# Patient Record
Sex: Female | Born: 1950 | Race: White | Hispanic: Yes | Marital: Single | Smoking: Never smoker
Health system: Southern US, Community
[De-identification: ages and names within clinical notes are randomized; demographics above are authoritative.]

---

## 2014-07-05 ENCOUNTER — Other Ambulatory Visit: Payer: Self-pay

## 2014-07-05 DIAGNOSIS — Z1231 Encounter for screening mammogram for malignant neoplasm of breast: Secondary | ICD-10-CM

## 2014-07-25 ENCOUNTER — Ambulatory Visit
Admission: RE | Admit: 2014-07-25 | Discharge: 2014-07-25 | Disposition: A | Discharge status: Home / Self Care | Payer: BC Managed Care – PPO | Source: Ambulatory Visit

## 2014-07-25 ENCOUNTER — Encounter (INDEPENDENT_AMBULATORY_CARE_PROVIDER_SITE_OTHER): Payer: Self-pay

## 2014-07-25 DIAGNOSIS — Z1231 Encounter for screening mammogram for malignant neoplasm of breast: Secondary | ICD-10-CM

## 2015-05-31 DIAGNOSIS — H43393 Other vitreous opacities, bilateral: Secondary | ICD-10-CM | POA: Diagnosis not present

## 2015-05-31 DIAGNOSIS — Z947 Corneal transplant status: Secondary | ICD-10-CM | POA: Diagnosis not present

## 2015-05-31 DIAGNOSIS — H2511 Age-related nuclear cataract, right eye: Secondary | ICD-10-CM | POA: Diagnosis not present

## 2015-06-06 DIAGNOSIS — H40003 Preglaucoma, unspecified, bilateral: Secondary | ICD-10-CM | POA: Diagnosis not present

## 2015-06-06 DIAGNOSIS — H2511 Age-related nuclear cataract, right eye: Secondary | ICD-10-CM | POA: Diagnosis not present

## 2015-06-06 DIAGNOSIS — Z961 Presence of intraocular lens: Secondary | ICD-10-CM | POA: Diagnosis not present

## 2015-06-06 DIAGNOSIS — H18603 Keratoconus, unspecified, bilateral: Secondary | ICD-10-CM | POA: Diagnosis not present

## 2015-07-10 DIAGNOSIS — H40013 Open angle with borderline findings, low risk, bilateral: Secondary | ICD-10-CM | POA: Diagnosis not present

## 2015-08-08 DIAGNOSIS — H4011X4 Primary open-angle glaucoma, indeterminate stage: Secondary | ICD-10-CM | POA: Diagnosis not present

## 2015-08-08 DIAGNOSIS — Z9889 Other specified postprocedural states: Secondary | ICD-10-CM | POA: Diagnosis not present

## 2015-08-08 DIAGNOSIS — H2511 Age-related nuclear cataract, right eye: Secondary | ICD-10-CM | POA: Diagnosis not present

## 2015-08-08 DIAGNOSIS — H18603 Keratoconus, unspecified, bilateral: Secondary | ICD-10-CM | POA: Diagnosis not present

## 2015-09-11 ENCOUNTER — Other Ambulatory Visit: Payer: Self-pay

## 2015-09-11 DIAGNOSIS — Z1231 Encounter for screening mammogram for malignant neoplasm of breast: Secondary | ICD-10-CM

## 2015-09-27 ENCOUNTER — Ambulatory Visit
Admission: RE | Admit: 2015-09-27 | Discharge: 2015-09-27 | Disposition: A | Discharge status: Home / Self Care | Payer: Medicare Other | Source: Ambulatory Visit

## 2015-09-27 DIAGNOSIS — Z1231 Encounter for screening mammogram for malignant neoplasm of breast: Secondary | ICD-10-CM | POA: Diagnosis not present

## 2015-10-01 DIAGNOSIS — H2511 Age-related nuclear cataract, right eye: Secondary | ICD-10-CM | POA: Diagnosis not present

## 2015-10-01 DIAGNOSIS — H401124 Primary open-angle glaucoma, left eye, indeterminate stage: Secondary | ICD-10-CM | POA: Diagnosis not present

## 2015-10-01 DIAGNOSIS — H40001 Preglaucoma, unspecified, right eye: Secondary | ICD-10-CM | POA: Diagnosis not present

## 2015-10-01 DIAGNOSIS — H18603 Keratoconus, unspecified, bilateral: Secondary | ICD-10-CM | POA: Diagnosis not present

## 2015-10-01 DIAGNOSIS — Z9889 Other specified postprocedural states: Secondary | ICD-10-CM | POA: Diagnosis not present

## 2015-10-01 DIAGNOSIS — Z961 Presence of intraocular lens: Secondary | ICD-10-CM | POA: Diagnosis not present

## 2015-11-20 DIAGNOSIS — E039 Hypothyroidism, unspecified: Secondary | ICD-10-CM | POA: Diagnosis not present

## 2015-11-20 DIAGNOSIS — Z780 Asymptomatic menopausal state: Secondary | ICD-10-CM | POA: Diagnosis not present

## 2015-11-20 DIAGNOSIS — Z0000 Encounter for general adult medical examination without abnormal findings: Secondary | ICD-10-CM | POA: Diagnosis not present

## 2015-11-20 DIAGNOSIS — Z79899 Other long term (current) drug therapy: Secondary | ICD-10-CM | POA: Diagnosis not present

## 2015-11-20 DIAGNOSIS — Z23 Encounter for immunization: Secondary | ICD-10-CM | POA: Diagnosis not present

## 2015-11-20 DIAGNOSIS — E7800 Pure hypercholesterolemia, unspecified: Secondary | ICD-10-CM | POA: Diagnosis not present

## 2015-11-21 DIAGNOSIS — Z79899 Other long term (current) drug therapy: Secondary | ICD-10-CM | POA: Diagnosis not present

## 2015-11-21 DIAGNOSIS — E7800 Pure hypercholesterolemia, unspecified: Secondary | ICD-10-CM | POA: Diagnosis not present

## 2015-11-21 DIAGNOSIS — E039 Hypothyroidism, unspecified: Secondary | ICD-10-CM | POA: Diagnosis not present

## 2015-12-12 DIAGNOSIS — M8589 Other specified disorders of bone density and structure, multiple sites: Secondary | ICD-10-CM | POA: Diagnosis not present

## 2015-12-12 DIAGNOSIS — Z780 Asymptomatic menopausal state: Secondary | ICD-10-CM | POA: Diagnosis not present

## 2015-12-25 DIAGNOSIS — H18603 Keratoconus, unspecified, bilateral: Secondary | ICD-10-CM | POA: Diagnosis not present

## 2015-12-25 DIAGNOSIS — Z9889 Other specified postprocedural states: Secondary | ICD-10-CM | POA: Diagnosis not present

## 2015-12-25 DIAGNOSIS — H04123 Dry eye syndrome of bilateral lacrimal glands: Secondary | ICD-10-CM | POA: Diagnosis not present

## 2015-12-25 DIAGNOSIS — H2511 Age-related nuclear cataract, right eye: Secondary | ICD-10-CM | POA: Diagnosis not present

## 2015-12-25 DIAGNOSIS — Z961 Presence of intraocular lens: Secondary | ICD-10-CM | POA: Diagnosis not present

## 2015-12-25 DIAGNOSIS — H40001 Preglaucoma, unspecified, right eye: Secondary | ICD-10-CM | POA: Diagnosis not present

## 2015-12-25 DIAGNOSIS — H401124 Primary open-angle glaucoma, left eye, indeterminate stage: Secondary | ICD-10-CM | POA: Diagnosis not present

## 2016-02-18 DIAGNOSIS — H40001 Preglaucoma, unspecified, right eye: Secondary | ICD-10-CM | POA: Diagnosis not present

## 2016-02-18 DIAGNOSIS — H401121 Primary open-angle glaucoma, left eye, mild stage: Secondary | ICD-10-CM | POA: Diagnosis not present

## 2016-02-18 DIAGNOSIS — Z9889 Other specified postprocedural states: Secondary | ICD-10-CM | POA: Diagnosis not present

## 2016-02-18 DIAGNOSIS — Z961 Presence of intraocular lens: Secondary | ICD-10-CM | POA: Diagnosis not present

## 2016-03-28 DIAGNOSIS — H43393 Other vitreous opacities, bilateral: Secondary | ICD-10-CM | POA: Diagnosis not present

## 2016-03-28 DIAGNOSIS — Z961 Presence of intraocular lens: Secondary | ICD-10-CM | POA: Diagnosis not present

## 2016-03-28 DIAGNOSIS — H2511 Age-related nuclear cataract, right eye: Secondary | ICD-10-CM | POA: Diagnosis not present

## 2016-03-28 DIAGNOSIS — H401124 Primary open-angle glaucoma, left eye, indeterminate stage: Secondary | ICD-10-CM | POA: Diagnosis not present

## 2016-03-28 DIAGNOSIS — T86848 Other complications of corneal transplant: Secondary | ICD-10-CM | POA: Diagnosis not present

## 2016-06-24 DIAGNOSIS — Z9889 Other specified postprocedural states: Secondary | ICD-10-CM | POA: Diagnosis not present

## 2016-06-24 DIAGNOSIS — H18603 Keratoconus, unspecified, bilateral: Secondary | ICD-10-CM | POA: Diagnosis not present

## 2016-06-24 DIAGNOSIS — H04123 Dry eye syndrome of bilateral lacrimal glands: Secondary | ICD-10-CM | POA: Diagnosis not present

## 2016-06-24 DIAGNOSIS — Z961 Presence of intraocular lens: Secondary | ICD-10-CM | POA: Diagnosis not present

## 2016-06-24 DIAGNOSIS — H401121 Primary open-angle glaucoma, left eye, mild stage: Secondary | ICD-10-CM | POA: Diagnosis not present

## 2016-07-02 DIAGNOSIS — H2511 Age-related nuclear cataract, right eye: Secondary | ICD-10-CM | POA: Diagnosis not present

## 2016-07-02 DIAGNOSIS — Z961 Presence of intraocular lens: Secondary | ICD-10-CM | POA: Diagnosis not present

## 2016-07-02 DIAGNOSIS — H40001 Preglaucoma, unspecified, right eye: Secondary | ICD-10-CM | POA: Diagnosis not present

## 2016-07-02 DIAGNOSIS — H401124 Primary open-angle glaucoma, left eye, indeterminate stage: Secondary | ICD-10-CM | POA: Diagnosis not present

## 2016-07-02 DIAGNOSIS — H401121 Primary open-angle glaucoma, left eye, mild stage: Secondary | ICD-10-CM | POA: Diagnosis not present

## 2016-07-02 DIAGNOSIS — Z9889 Other specified postprocedural states: Secondary | ICD-10-CM | POA: Diagnosis not present

## 2016-08-31 DIAGNOSIS — R300 Dysuria: Secondary | ICD-10-CM | POA: Diagnosis not present

## 2016-08-31 DIAGNOSIS — N390 Urinary tract infection, site not specified: Secondary | ICD-10-CM | POA: Diagnosis not present

## 2016-10-23 ENCOUNTER — Other Ambulatory Visit: Payer: Self-pay | Admitting: Geriatric Medicine

## 2016-10-29 ENCOUNTER — Other Ambulatory Visit: Payer: Self-pay | Admitting: Geriatric Medicine

## 2016-10-29 DIAGNOSIS — Z1231 Encounter for screening mammogram for malignant neoplasm of breast: Secondary | ICD-10-CM

## 2016-11-21 DIAGNOSIS — Z1389 Encounter for screening for other disorder: Secondary | ICD-10-CM | POA: Diagnosis not present

## 2016-11-21 DIAGNOSIS — Z79899 Other long term (current) drug therapy: Secondary | ICD-10-CM | POA: Diagnosis not present

## 2016-11-21 DIAGNOSIS — Z6835 Body mass index (BMI) 35.0-35.9, adult: Secondary | ICD-10-CM | POA: Diagnosis not present

## 2016-11-21 DIAGNOSIS — Z23 Encounter for immunization: Secondary | ICD-10-CM | POA: Diagnosis not present

## 2016-11-21 DIAGNOSIS — E039 Hypothyroidism, unspecified: Secondary | ICD-10-CM | POA: Diagnosis not present

## 2016-11-21 DIAGNOSIS — E669 Obesity, unspecified: Secondary | ICD-10-CM | POA: Diagnosis not present

## 2016-11-21 DIAGNOSIS — E782 Mixed hyperlipidemia: Secondary | ICD-10-CM | POA: Diagnosis not present

## 2016-11-21 DIAGNOSIS — Z0000 Encounter for general adult medical examination without abnormal findings: Secondary | ICD-10-CM | POA: Diagnosis not present

## 2016-11-21 DIAGNOSIS — Z1239 Encounter for other screening for malignant neoplasm of breast: Secondary | ICD-10-CM | POA: Diagnosis not present

## 2016-12-02 ENCOUNTER — Ambulatory Visit
Admission: RE | Admit: 2016-12-02 | Discharge: 2016-12-02 | Disposition: A | Discharge status: Home / Self Care | Payer: Medicare Other | Source: Ambulatory Visit | Attending: Geriatric Medicine | Admitting: Geriatric Medicine

## 2016-12-02 ENCOUNTER — Encounter: Payer: Self-pay | Admitting: Radiology

## 2016-12-02 DIAGNOSIS — Z1231 Encounter for screening mammogram for malignant neoplasm of breast: Secondary | ICD-10-CM

## 2017-01-08 DIAGNOSIS — Z9889 Other specified postprocedural states: Secondary | ICD-10-CM | POA: Diagnosis not present

## 2017-01-08 DIAGNOSIS — H401121 Primary open-angle glaucoma, left eye, mild stage: Secondary | ICD-10-CM | POA: Diagnosis not present

## 2017-01-08 DIAGNOSIS — Z961 Presence of intraocular lens: Secondary | ICD-10-CM | POA: Diagnosis not present

## 2017-01-08 DIAGNOSIS — H2511 Age-related nuclear cataract, right eye: Secondary | ICD-10-CM | POA: Diagnosis not present

## 2017-01-08 DIAGNOSIS — H18603 Keratoconus, unspecified, bilateral: Secondary | ICD-10-CM | POA: Diagnosis not present

## 2017-01-08 DIAGNOSIS — H40001 Preglaucoma, unspecified, right eye: Secondary | ICD-10-CM | POA: Diagnosis not present

## 2017-03-05 DIAGNOSIS — S39012A Strain of muscle, fascia and tendon of lower back, initial encounter: Secondary | ICD-10-CM | POA: Diagnosis not present

## 2017-03-27 DIAGNOSIS — Z9889 Other specified postprocedural states: Secondary | ICD-10-CM | POA: Diagnosis not present

## 2017-03-27 DIAGNOSIS — H18603 Keratoconus, unspecified, bilateral: Secondary | ICD-10-CM | POA: Diagnosis not present

## 2017-07-28 DIAGNOSIS — Z961 Presence of intraocular lens: Secondary | ICD-10-CM | POA: Diagnosis not present

## 2017-07-28 DIAGNOSIS — H2511 Age-related nuclear cataract, right eye: Secondary | ICD-10-CM | POA: Diagnosis not present

## 2017-07-28 DIAGNOSIS — H43393 Other vitreous opacities, bilateral: Secondary | ICD-10-CM | POA: Diagnosis not present

## 2017-07-28 DIAGNOSIS — H18613 Keratoconus, stable, bilateral: Secondary | ICD-10-CM | POA: Diagnosis not present

## 2017-07-28 DIAGNOSIS — Z947 Corneal transplant status: Secondary | ICD-10-CM | POA: Diagnosis not present

## 2017-07-28 DIAGNOSIS — H401124 Primary open-angle glaucoma, left eye, indeterminate stage: Secondary | ICD-10-CM | POA: Diagnosis not present

## 2017-08-11 DIAGNOSIS — B078 Other viral warts: Secondary | ICD-10-CM | POA: Diagnosis not present

## 2017-08-11 DIAGNOSIS — L821 Other seborrheic keratosis: Secondary | ICD-10-CM | POA: Diagnosis not present

## 2017-08-11 DIAGNOSIS — L814 Other melanin hyperpigmentation: Secondary | ICD-10-CM | POA: Diagnosis not present

## 2017-09-22 DIAGNOSIS — Z23 Encounter for immunization: Secondary | ICD-10-CM | POA: Diagnosis not present

## 2017-10-06 DIAGNOSIS — J9801 Acute bronchospasm: Secondary | ICD-10-CM | POA: Diagnosis not present

## 2017-10-06 DIAGNOSIS — J0110 Acute frontal sinusitis, unspecified: Secondary | ICD-10-CM | POA: Diagnosis not present

## 2017-10-13 DIAGNOSIS — Z947 Corneal transplant status: Secondary | ICD-10-CM | POA: Diagnosis not present

## 2017-10-13 DIAGNOSIS — H18613 Keratoconus, stable, bilateral: Secondary | ICD-10-CM | POA: Diagnosis not present

## 2017-11-13 DIAGNOSIS — H18613 Keratoconus, stable, bilateral: Secondary | ICD-10-CM | POA: Diagnosis not present

## 2017-11-27 DIAGNOSIS — H40001 Preglaucoma, unspecified, right eye: Secondary | ICD-10-CM | POA: Diagnosis not present

## 2017-11-27 DIAGNOSIS — H18603 Keratoconus, unspecified, bilateral: Secondary | ICD-10-CM | POA: Diagnosis not present

## 2017-11-27 DIAGNOSIS — H2511 Age-related nuclear cataract, right eye: Secondary | ICD-10-CM | POA: Diagnosis not present

## 2017-11-27 DIAGNOSIS — H401121 Primary open-angle glaucoma, left eye, mild stage: Secondary | ICD-10-CM | POA: Diagnosis not present

## 2017-11-27 DIAGNOSIS — Z9889 Other specified postprocedural states: Secondary | ICD-10-CM | POA: Diagnosis not present

## 2017-11-27 DIAGNOSIS — Z961 Presence of intraocular lens: Secondary | ICD-10-CM | POA: Diagnosis not present

## 2017-12-08 DIAGNOSIS — Z6836 Body mass index (BMI) 36.0-36.9, adult: Secondary | ICD-10-CM | POA: Diagnosis not present

## 2017-12-08 DIAGNOSIS — Z0000 Encounter for general adult medical examination without abnormal findings: Secondary | ICD-10-CM | POA: Diagnosis not present

## 2017-12-08 DIAGNOSIS — E669 Obesity, unspecified: Secondary | ICD-10-CM | POA: Diagnosis not present

## 2017-12-08 DIAGNOSIS — E039 Hypothyroidism, unspecified: Secondary | ICD-10-CM | POA: Diagnosis not present

## 2017-12-08 DIAGNOSIS — Z1389 Encounter for screening for other disorder: Secondary | ICD-10-CM | POA: Diagnosis not present

## 2017-12-08 DIAGNOSIS — R030 Elevated blood-pressure reading, without diagnosis of hypertension: Secondary | ICD-10-CM | POA: Diagnosis not present

## 2017-12-08 DIAGNOSIS — Z79899 Other long term (current) drug therapy: Secondary | ICD-10-CM | POA: Diagnosis not present

## 2017-12-08 DIAGNOSIS — E782 Mixed hyperlipidemia: Secondary | ICD-10-CM | POA: Diagnosis not present

## 2017-12-08 DIAGNOSIS — Z1239 Encounter for other screening for malignant neoplasm of breast: Secondary | ICD-10-CM | POA: Diagnosis not present

## 2017-12-11 ENCOUNTER — Other Ambulatory Visit: Payer: Self-pay | Admitting: Geriatric Medicine

## 2017-12-11 DIAGNOSIS — Z1231 Encounter for screening mammogram for malignant neoplasm of breast: Secondary | ICD-10-CM

## 2017-12-31 ENCOUNTER — Ambulatory Visit
Admission: RE | Admit: 2017-12-31 | Discharge: 2017-12-31 | Disposition: A | Discharge status: Home / Self Care | Payer: Medicare Other | Source: Ambulatory Visit | Attending: Geriatric Medicine | Admitting: Geriatric Medicine

## 2017-12-31 DIAGNOSIS — Z1231 Encounter for screening mammogram for malignant neoplasm of breast: Secondary | ICD-10-CM

## 2018-01-22 DIAGNOSIS — H2511 Age-related nuclear cataract, right eye: Secondary | ICD-10-CM | POA: Diagnosis not present

## 2018-01-22 DIAGNOSIS — H40001 Preglaucoma, unspecified, right eye: Secondary | ICD-10-CM | POA: Diagnosis not present

## 2018-01-22 DIAGNOSIS — H401121 Primary open-angle glaucoma, left eye, mild stage: Secondary | ICD-10-CM | POA: Diagnosis not present

## 2018-01-22 DIAGNOSIS — Z961 Presence of intraocular lens: Secondary | ICD-10-CM | POA: Diagnosis not present

## 2018-01-22 DIAGNOSIS — Z9889 Other specified postprocedural states: Secondary | ICD-10-CM | POA: Diagnosis not present

## 2018-03-09 DIAGNOSIS — E669 Obesity, unspecified: Secondary | ICD-10-CM | POA: Diagnosis not present

## 2018-03-30 DIAGNOSIS — Z9889 Other specified postprocedural states: Secondary | ICD-10-CM | POA: Diagnosis not present

## 2018-03-30 DIAGNOSIS — H18603 Keratoconus, unspecified, bilateral: Secondary | ICD-10-CM | POA: Diagnosis not present

## 2018-03-30 DIAGNOSIS — H2511 Age-related nuclear cataract, right eye: Secondary | ICD-10-CM | POA: Diagnosis not present

## 2018-06-24 DIAGNOSIS — Z961 Presence of intraocular lens: Secondary | ICD-10-CM | POA: Diagnosis not present

## 2018-06-24 DIAGNOSIS — H401121 Primary open-angle glaucoma, left eye, mild stage: Secondary | ICD-10-CM | POA: Diagnosis not present

## 2018-06-24 DIAGNOSIS — H2511 Age-related nuclear cataract, right eye: Secondary | ICD-10-CM | POA: Diagnosis not present

## 2018-06-24 DIAGNOSIS — H18603 Keratoconus, unspecified, bilateral: Secondary | ICD-10-CM | POA: Diagnosis not present

## 2018-06-24 DIAGNOSIS — Z9889 Other specified postprocedural states: Secondary | ICD-10-CM | POA: Diagnosis not present

## 2018-09-21 DIAGNOSIS — Z23 Encounter for immunization: Secondary | ICD-10-CM | POA: Diagnosis not present

## 2018-11-22 DIAGNOSIS — J209 Acute bronchitis, unspecified: Secondary | ICD-10-CM | POA: Diagnosis not present

## 2018-12-10 DIAGNOSIS — Z79899 Other long term (current) drug therapy: Secondary | ICD-10-CM | POA: Diagnosis not present

## 2018-12-10 DIAGNOSIS — Z0000 Encounter for general adult medical examination without abnormal findings: Secondary | ICD-10-CM | POA: Diagnosis not present

## 2018-12-10 DIAGNOSIS — E7800 Pure hypercholesterolemia, unspecified: Secondary | ICD-10-CM | POA: Diagnosis not present

## 2018-12-10 DIAGNOSIS — E039 Hypothyroidism, unspecified: Secondary | ICD-10-CM | POA: Diagnosis not present

## 2018-12-10 DIAGNOSIS — Z1389 Encounter for screening for other disorder: Secondary | ICD-10-CM | POA: Diagnosis not present

## 2018-12-27 ENCOUNTER — Other Ambulatory Visit: Payer: Self-pay | Admitting: Geriatric Medicine

## 2018-12-27 DIAGNOSIS — Z1231 Encounter for screening mammogram for malignant neoplasm of breast: Secondary | ICD-10-CM

## 2019-01-24 ENCOUNTER — Ambulatory Visit
Admission: RE | Admit: 2019-01-24 | Discharge: 2019-01-24 | Disposition: A | Discharge status: Home / Self Care | Payer: Medicare Other | Source: Ambulatory Visit | Attending: Geriatric Medicine | Admitting: Geriatric Medicine

## 2019-01-24 DIAGNOSIS — Z1231 Encounter for screening mammogram for malignant neoplasm of breast: Secondary | ICD-10-CM | POA: Diagnosis not present

## 2019-06-16 DIAGNOSIS — H2511 Age-related nuclear cataract, right eye: Secondary | ICD-10-CM | POA: Diagnosis not present

## 2019-06-16 DIAGNOSIS — H40001 Preglaucoma, unspecified, right eye: Secondary | ICD-10-CM | POA: Diagnosis not present

## 2019-06-16 DIAGNOSIS — Z9889 Other specified postprocedural states: Secondary | ICD-10-CM | POA: Diagnosis not present

## 2019-06-16 DIAGNOSIS — H25011 Cortical age-related cataract, right eye: Secondary | ICD-10-CM | POA: Diagnosis not present

## 2019-06-16 DIAGNOSIS — H401123 Primary open-angle glaucoma, left eye, severe stage: Secondary | ICD-10-CM | POA: Diagnosis not present

## 2019-06-16 DIAGNOSIS — H18603 Keratoconus, unspecified, bilateral: Secondary | ICD-10-CM | POA: Diagnosis not present

## 2019-09-06 DIAGNOSIS — Z23 Encounter for immunization: Secondary | ICD-10-CM | POA: Diagnosis not present

## 2019-09-09 DIAGNOSIS — H401123 Primary open-angle glaucoma, left eye, severe stage: Secondary | ICD-10-CM | POA: Diagnosis not present

## 2019-09-09 DIAGNOSIS — H18603 Keratoconus, unspecified, bilateral: Secondary | ICD-10-CM | POA: Diagnosis not present

## 2019-09-09 DIAGNOSIS — Z9889 Other specified postprocedural states: Secondary | ICD-10-CM | POA: Diagnosis not present

## 2019-10-24 DIAGNOSIS — N390 Urinary tract infection, site not specified: Secondary | ICD-10-CM | POA: Diagnosis not present

## 2019-12-20 DIAGNOSIS — Z79899 Other long term (current) drug therapy: Secondary | ICD-10-CM | POA: Diagnosis not present

## 2019-12-20 DIAGNOSIS — Z1389 Encounter for screening for other disorder: Secondary | ICD-10-CM | POA: Diagnosis not present

## 2019-12-20 DIAGNOSIS — E039 Hypothyroidism, unspecified: Secondary | ICD-10-CM | POA: Diagnosis not present

## 2019-12-20 DIAGNOSIS — Z0000 Encounter for general adult medical examination without abnormal findings: Secondary | ICD-10-CM | POA: Diagnosis not present

## 2019-12-20 DIAGNOSIS — E782 Mixed hyperlipidemia: Secondary | ICD-10-CM | POA: Diagnosis not present

## 2019-12-26 ENCOUNTER — Other Ambulatory Visit: Payer: Self-pay | Admitting: Geriatric Medicine

## 2019-12-26 DIAGNOSIS — Z1231 Encounter for screening mammogram for malignant neoplasm of breast: Secondary | ICD-10-CM

## 2020-01-31 ENCOUNTER — Other Ambulatory Visit: Payer: Self-pay

## 2020-01-31 ENCOUNTER — Ambulatory Visit
Admission: RE | Admit: 2020-01-31 | Discharge: 2020-01-31 | Disposition: A | Discharge status: Home / Self Care | Payer: Medicare Other | Source: Ambulatory Visit | Attending: Geriatric Medicine | Admitting: Geriatric Medicine

## 2020-01-31 DIAGNOSIS — Z1231 Encounter for screening mammogram for malignant neoplasm of breast: Secondary | ICD-10-CM

## 2020-05-10 DIAGNOSIS — H40001 Preglaucoma, unspecified, right eye: Secondary | ICD-10-CM | POA: Diagnosis not present

## 2020-05-10 DIAGNOSIS — H401122 Primary open-angle glaucoma, left eye, moderate stage: Secondary | ICD-10-CM | POA: Diagnosis not present

## 2020-09-13 DIAGNOSIS — H40001 Preglaucoma, unspecified, right eye: Secondary | ICD-10-CM | POA: Diagnosis not present

## 2020-09-13 DIAGNOSIS — H401122 Primary open-angle glaucoma, left eye, moderate stage: Secondary | ICD-10-CM | POA: Diagnosis not present

## 2020-09-18 DIAGNOSIS — Z947 Corneal transplant status: Secondary | ICD-10-CM | POA: Diagnosis not present

## 2020-12-21 DIAGNOSIS — Z0000 Encounter for general adult medical examination without abnormal findings: Secondary | ICD-10-CM | POA: Diagnosis not present

## 2020-12-21 DIAGNOSIS — Z79899 Other long term (current) drug therapy: Secondary | ICD-10-CM | POA: Diagnosis not present

## 2020-12-21 DIAGNOSIS — E039 Hypothyroidism, unspecified: Secondary | ICD-10-CM | POA: Diagnosis not present

## 2020-12-21 DIAGNOSIS — Z1389 Encounter for screening for other disorder: Secondary | ICD-10-CM | POA: Diagnosis not present

## 2020-12-21 DIAGNOSIS — Z6839 Body mass index (BMI) 39.0-39.9, adult: Secondary | ICD-10-CM | POA: Diagnosis not present

## 2020-12-21 DIAGNOSIS — E782 Mixed hyperlipidemia: Secondary | ICD-10-CM | POA: Diagnosis not present

## 2021-01-10 ENCOUNTER — Other Ambulatory Visit: Payer: Self-pay | Admitting: Geriatric Medicine

## 2021-01-10 DIAGNOSIS — Z1231 Encounter for screening mammogram for malignant neoplasm of breast: Secondary | ICD-10-CM

## 2021-03-01 ENCOUNTER — Inpatient Hospital Stay: Admission: RE | Admit: 2021-03-01 | Payer: Medicare Other | Source: Ambulatory Visit

## 2021-03-07 DIAGNOSIS — Z23 Encounter for immunization: Secondary | ICD-10-CM | POA: Diagnosis not present

## 2021-03-12 DIAGNOSIS — H18601 Keratoconus, unspecified, right eye: Secondary | ICD-10-CM | POA: Diagnosis not present

## 2021-03-28 DIAGNOSIS — H401122 Primary open-angle glaucoma, left eye, moderate stage: Secondary | ICD-10-CM | POA: Diagnosis not present

## 2021-04-04 DIAGNOSIS — H524 Presbyopia: Secondary | ICD-10-CM | POA: Diagnosis not present

## 2021-04-04 DIAGNOSIS — H18611 Keratoconus, stable, right eye: Secondary | ICD-10-CM | POA: Diagnosis not present

## 2021-04-04 DIAGNOSIS — Z967 Presence of other bone and tendon implants: Secondary | ICD-10-CM | POA: Diagnosis not present

## 2021-04-12 ENCOUNTER — Other Ambulatory Visit: Payer: Self-pay

## 2021-04-12 ENCOUNTER — Ambulatory Visit
Admission: RE | Admit: 2021-04-12 | Discharge: 2021-04-12 | Disposition: A | Discharge status: Home / Self Care | Payer: Medicare Other | Source: Ambulatory Visit | Attending: Geriatric Medicine | Admitting: Geriatric Medicine

## 2021-04-12 DIAGNOSIS — Z1231 Encounter for screening mammogram for malignant neoplasm of breast: Secondary | ICD-10-CM | POA: Diagnosis not present

## 2021-08-08 DIAGNOSIS — Z23 Encounter for immunization: Secondary | ICD-10-CM | POA: Diagnosis not present

## 2021-09-16 DIAGNOSIS — H04123 Dry eye syndrome of bilateral lacrimal glands: Secondary | ICD-10-CM | POA: Diagnosis not present

## 2021-09-16 DIAGNOSIS — H409 Unspecified glaucoma: Secondary | ICD-10-CM | POA: Diagnosis not present

## 2021-09-16 DIAGNOSIS — Z7689 Persons encountering health services in other specified circumstances: Secondary | ICD-10-CM | POA: Diagnosis not present

## 2021-09-16 DIAGNOSIS — E039 Hypothyroidism, unspecified: Secondary | ICD-10-CM | POA: Diagnosis not present

## 2021-09-17 DIAGNOSIS — E559 Vitamin D deficiency, unspecified: Secondary | ICD-10-CM | POA: Diagnosis not present

## 2021-09-17 DIAGNOSIS — E569 Vitamin deficiency, unspecified: Secondary | ICD-10-CM | POA: Diagnosis not present

## 2021-09-17 DIAGNOSIS — I10 Essential (primary) hypertension: Secondary | ICD-10-CM | POA: Diagnosis not present

## 2021-10-23 DIAGNOSIS — M79674 Pain in right toe(s): Secondary | ICD-10-CM | POA: Diagnosis not present

## 2021-10-23 DIAGNOSIS — M109 Gout, unspecified: Secondary | ICD-10-CM | POA: Diagnosis not present

## 2021-10-23 DIAGNOSIS — R2241 Localized swelling, mass and lump, right lower limb: Secondary | ICD-10-CM | POA: Diagnosis not present

## 2021-10-23 DIAGNOSIS — L539 Erythematous condition, unspecified: Secondary | ICD-10-CM | POA: Diagnosis not present

## 2021-10-24 DIAGNOSIS — I10 Essential (primary) hypertension: Secondary | ICD-10-CM | POA: Diagnosis not present

## 2021-10-24 DIAGNOSIS — E569 Vitamin deficiency, unspecified: Secondary | ICD-10-CM | POA: Diagnosis not present

## 2022-01-16 DIAGNOSIS — H401122 Primary open-angle glaucoma, left eye, moderate stage: Secondary | ICD-10-CM | POA: Diagnosis not present

## 2022-01-23 DIAGNOSIS — Z7189 Other specified counseling: Secondary | ICD-10-CM | POA: Diagnosis not present

## 2022-01-23 DIAGNOSIS — Z23 Encounter for immunization: Secondary | ICD-10-CM | POA: Diagnosis not present

## 2022-02-27 DIAGNOSIS — Z6841 Body Mass Index (BMI) 40.0 and over, adult: Secondary | ICD-10-CM | POA: Diagnosis not present

## 2022-02-27 DIAGNOSIS — L918 Other hypertrophic disorders of the skin: Secondary | ICD-10-CM | POA: Diagnosis not present

## 2022-03-17 DIAGNOSIS — Z947 Corneal transplant status: Secondary | ICD-10-CM | POA: Diagnosis not present

## 2022-03-17 DIAGNOSIS — H18601 Keratoconus, unspecified, right eye: Secondary | ICD-10-CM | POA: Diagnosis not present

## 2022-04-01 DIAGNOSIS — E039 Hypothyroidism, unspecified: Secondary | ICD-10-CM | POA: Diagnosis not present

## 2022-04-01 DIAGNOSIS — E782 Mixed hyperlipidemia: Secondary | ICD-10-CM | POA: Diagnosis not present

## 2022-04-01 DIAGNOSIS — Z131 Encounter for screening for diabetes mellitus: Secondary | ICD-10-CM | POA: Diagnosis not present

## 2022-04-14 DIAGNOSIS — H401122 Primary open-angle glaucoma, left eye, moderate stage: Secondary | ICD-10-CM | POA: Diagnosis not present

## 2022-04-15 ENCOUNTER — Other Ambulatory Visit: Payer: Self-pay | Admitting: Internal Medicine

## 2022-04-15 DIAGNOSIS — Z1231 Encounter for screening mammogram for malignant neoplasm of breast: Secondary | ICD-10-CM

## 2022-04-17 DIAGNOSIS — Z23 Encounter for immunization: Secondary | ICD-10-CM | POA: Diagnosis not present

## 2022-04-25 ENCOUNTER — Ambulatory Visit
Admission: RE | Admit: 2022-04-25 | Discharge: 2022-04-25 | Disposition: A | Discharge status: Home / Self Care | Payer: Medicare Other | Source: Ambulatory Visit | Attending: Internal Medicine | Admitting: Internal Medicine

## 2022-04-25 DIAGNOSIS — Z1231 Encounter for screening mammogram for malignant neoplasm of breast: Secondary | ICD-10-CM

## 2022-05-01 DIAGNOSIS — R7303 Prediabetes: Secondary | ICD-10-CM | POA: Diagnosis not present

## 2022-05-01 DIAGNOSIS — E782 Mixed hyperlipidemia: Secondary | ICD-10-CM | POA: Diagnosis not present

## 2022-05-01 DIAGNOSIS — E039 Hypothyroidism, unspecified: Secondary | ICD-10-CM | POA: Diagnosis not present

## 2022-07-08 DIAGNOSIS — R7303 Prediabetes: Secondary | ICD-10-CM | POA: Diagnosis not present

## 2022-07-08 DIAGNOSIS — E782 Mixed hyperlipidemia: Secondary | ICD-10-CM | POA: Diagnosis not present

## 2022-07-08 DIAGNOSIS — E039 Hypothyroidism, unspecified: Secondary | ICD-10-CM | POA: Diagnosis not present

## 2022-08-07 DIAGNOSIS — H401122 Primary open-angle glaucoma, left eye, moderate stage: Secondary | ICD-10-CM | POA: Diagnosis not present

## 2022-09-03 DIAGNOSIS — Z23 Encounter for immunization: Secondary | ICD-10-CM | POA: Diagnosis not present

## 2022-09-17 DIAGNOSIS — H18611 Keratoconus, stable, right eye: Secondary | ICD-10-CM | POA: Diagnosis not present

## 2022-09-17 DIAGNOSIS — H524 Presbyopia: Secondary | ICD-10-CM | POA: Diagnosis not present

## 2022-09-17 DIAGNOSIS — Z947 Corneal transplant status: Secondary | ICD-10-CM | POA: Diagnosis not present

## 2022-10-14 ENCOUNTER — Encounter: Payer: Self-pay | Admitting: Internal Medicine

## 2022-10-14 ENCOUNTER — Ambulatory Visit (INDEPENDENT_AMBULATORY_CARE_PROVIDER_SITE_OTHER): Payer: Medicare Other | Admitting: Internal Medicine

## 2022-10-14 VITALS — BP 124/80 | HR 78 | Temp 98.2°F | Resp 18 | Ht <= 58 in | Wt 182.8 lb

## 2022-10-14 DIAGNOSIS — E785 Hyperlipidemia, unspecified: Secondary | ICD-10-CM | POA: Diagnosis not present

## 2022-10-14 DIAGNOSIS — E039 Hypothyroidism, unspecified: Secondary | ICD-10-CM | POA: Diagnosis not present

## 2022-10-14 MED ORDER — SHINGRIX 50 MCG/0.5ML IM SUSR: 0.5000 mL | Freq: Once | INTRAMUSCULAR | 0 refills | Status: AC

## 2022-10-14 NOTE — Patient Instructions (Signed)
Her BMI is 38, with dyslipidema and hypothyroidism make it morbidly obese. She will continue to loose weight and do exercise.

## 2022-10-14 NOTE — Progress Notes (Signed)
   Established Patient Office Visit  Subjective   Patient ID: Robyn Cooper, female    DOB: 1951/09/07  Age: 71 y.o. MRN: [id number]  Chief Complaint  Patient presents with   Follow-up    HPI 71 years old female with history of hyperlipidemia, hypothyroidism and obesity.  She says that she has no complaint at the moment.  She takes her medications without any side effect.  She is not able to lose weight.  She is watching her diet and doing regular exercise.  I have reviewed her labs and medication with her.  She is asking for shingrex vaccine.  She He got flu and COVID-vaccine.   Review of Systems  Constitutional: Negative.   HENT: Negative.    Respiratory: Negative.    Cardiovascular: Negative.   Gastrointestinal: Negative.   Musculoskeletal: Negative.       Objective:     BP 124/80 (BP Location: Left Arm, Patient Position: Sitting, Cuff Size: Large)   Pulse 78   Temp 98.2 F (36.8 C) (Temporal)   Resp 18   Ht 4\' 10"  (1.473 m)   Wt 182 lb 12.8 oz (82.9 kg)   SpO2 98%   BMI 38.21 kg/m    Physical Exam Constitutional:      Appearance: Normal appearance. She is obese.  HENT:     Head: Normocephalic and atraumatic.  Eyes:     Extraocular Movements: Extraocular movements intact.     Pupils: Pupils are equal, round, and reactive to light.  Cardiovascular:     Pulses: Normal pulses.     Heart sounds: Normal heart sounds.  Pulmonary:     Effort: Pulmonary effort is normal.     Breath sounds: Normal breath sounds.  Abdominal:     General: Bowel sounds are normal.     Palpations: Abdomen is soft.  Neurological:     General: No focal deficit present.     Mental Status: She is alert and oriented to person, place, and time.      No results found for any visits on 10/14/22.   The ASCVD Risk score (Arnett DK, et al., 2019) failed to calculate for the following reasons:   Cannot find a previous HDL lab   Cannot find a previous total cholesterol lab     Assessment & Plan:   Problem List Items Addressed This Visit       Endocrine   Hypothyroidism (acquired) - Primary   Relevant Medications   levothyroxine (SYNTHROID) 75 MCG tablet     Other   Dyslipidemia   Relevant Medications   simvastatin (ZOCOR) 20 MG tablet    Return in about 3 months (around 01/14/2023).    01/16/2023, MD

## 2022-11-11 ENCOUNTER — Encounter: Payer: Self-pay | Admitting: Internal Medicine

## 2022-11-11 ENCOUNTER — Ambulatory Visit: Payer: Medicare Other | Admitting: Internal Medicine

## 2022-11-11 VITALS — BP 124/80 | HR 82 | Temp 97.2°F | Resp 18 | Ht <= 58 in | Wt 179.6 lb

## 2022-11-11 DIAGNOSIS — M79671 Pain in right foot: Secondary | ICD-10-CM

## 2022-11-11 MED ORDER — CELECOXIB 200 MG PO CAPS: 200.0000 mg | ORAL_CAPSULE | Freq: Every day | ORAL | 0 refills | Status: DC

## 2022-11-11 NOTE — Progress Notes (Signed)
   Acute Office Visit  Subjective:     Patient ID: Robyn Cooper, female    DOB: [birth year], 71 y.o.   MRN: [id number]  Chief Complaint  Patient presents with   Foot Pain    Possible gout flare up starting two days ago    Foot Pain   Patient is in today for right ist metatarsophalyngeal joint of right foot for 3 days. She denies any injury or trauma. She has gout in her right great toe before and she thought, her gout has flare up gain. She took ibuprofen that has helped a lot.   Review of Systems  Respiratory: Negative.    Musculoskeletal:  Positive for joint pain.        Objective:    BP 124/80 (BP Location: Left Arm, Patient Position: Sitting, Cuff Size: Normal)   Pulse 82   Temp (!) 97.2 F (36.2 C) (Temporal)   Resp 18   Ht 4\' 10"  (1.473 m)   Wt 179 lb 9.6 oz (81.5 kg)   SpO2 94%   BMI 37.54 kg/m    Physical Exam Musculoskeletal:        General: Tenderness present.     Comments: Right great toe tenderness but no pain on movement of joint.  Neurological:     Mental Status: She is oriented to person, place, and time.     No results found for any visits on 11/11/22.      Assessment & Plan:   Problem List Items Addressed This Visit       Other   Acute foot pain, right - Primary  She will take celebrex 200 mg daily for 2 weeks then as needed basis.  No orders of the defined types were placed in this encounter.   No follow-ups on file.  11/13/22, MD

## 2022-11-18 IMAGING — MG MM DIGITAL SCREENING BILAT W/ TOMO AND CAD
8 series · 8 of 24 positions shown · non-contrast
Comparison: Previous exam(s).

CLINICAL DATA: Screening.

EXAM:
DIGITAL SCREENING BILATERAL MAMMOGRAM WITH TOMOSYNTHESIS AND CAD
TECHNIQUE: Bilateral screening digital craniocaudal and mediolateral oblique
mammograms were obtained. Bilateral screening digital breast
tomosynthesis was performed. The images were evaluated with
computer-aided detection.

[L MLO synth-2D]
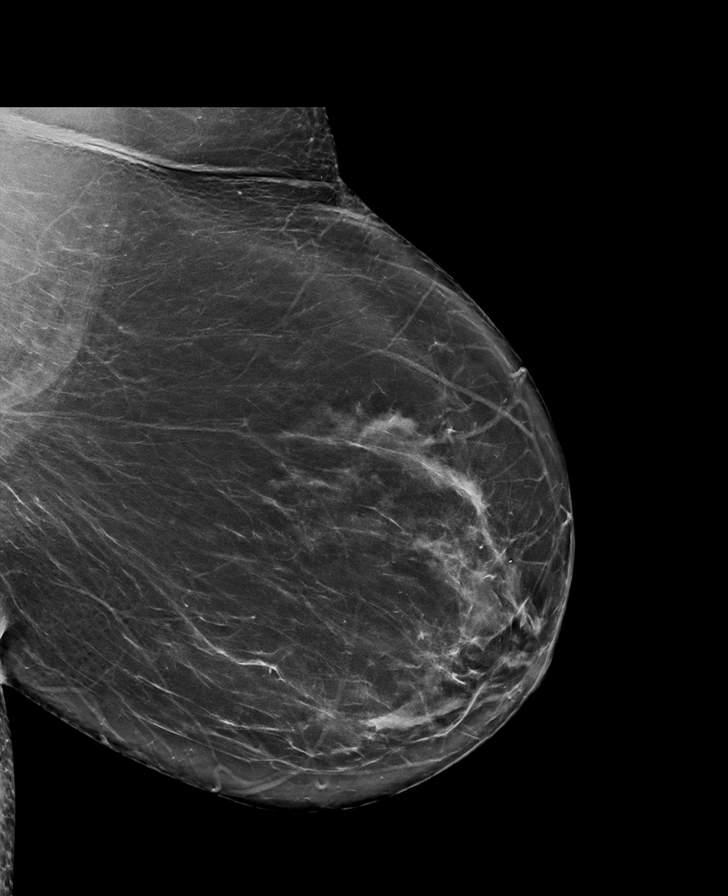

[R CC synth-2D]
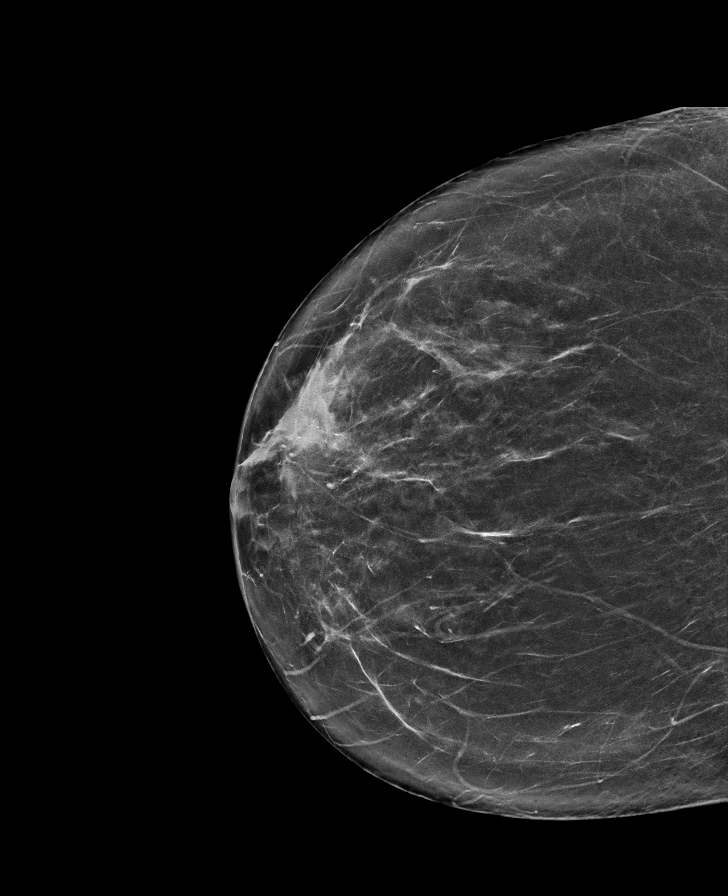

[R MLO synth-2D]
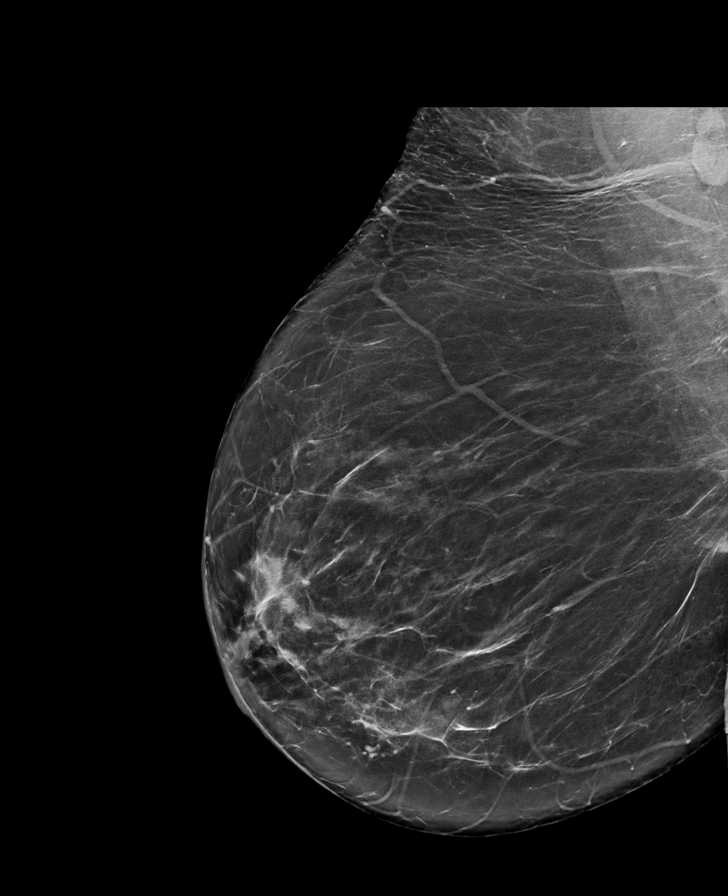

[L CC synth-2D]
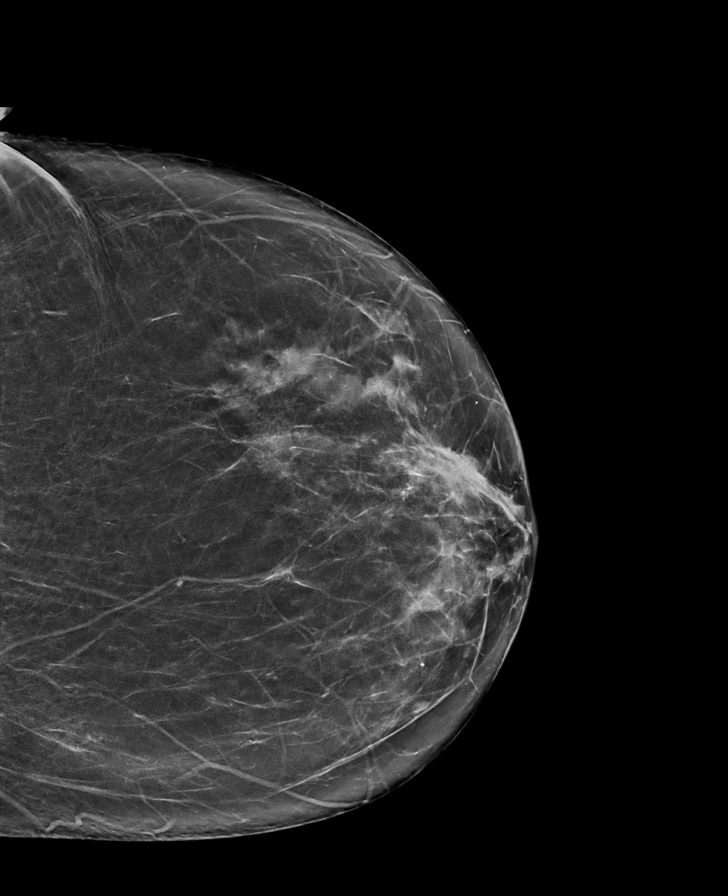

[R CC tomo · tomo slice 37/72.0]
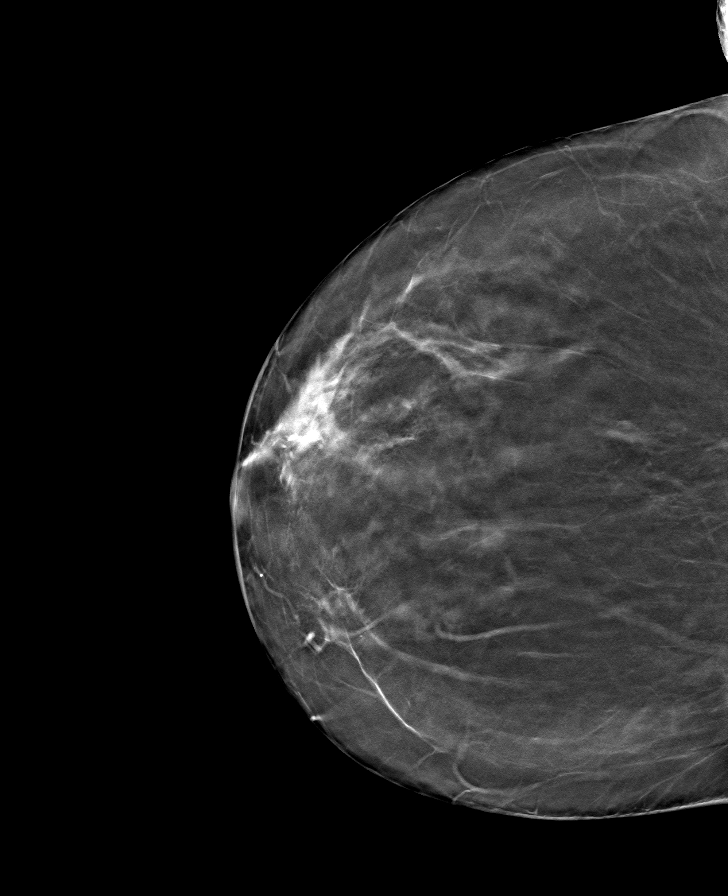

[R MLO tomo · tomo slice 43/85.0]
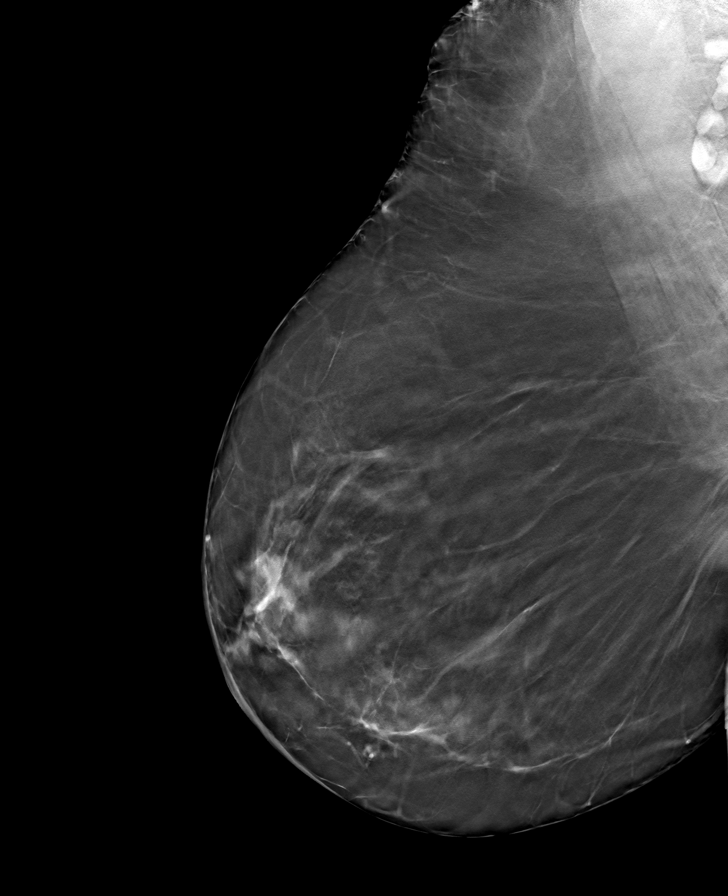

[L CC tomo · tomo slice 37/74.0]
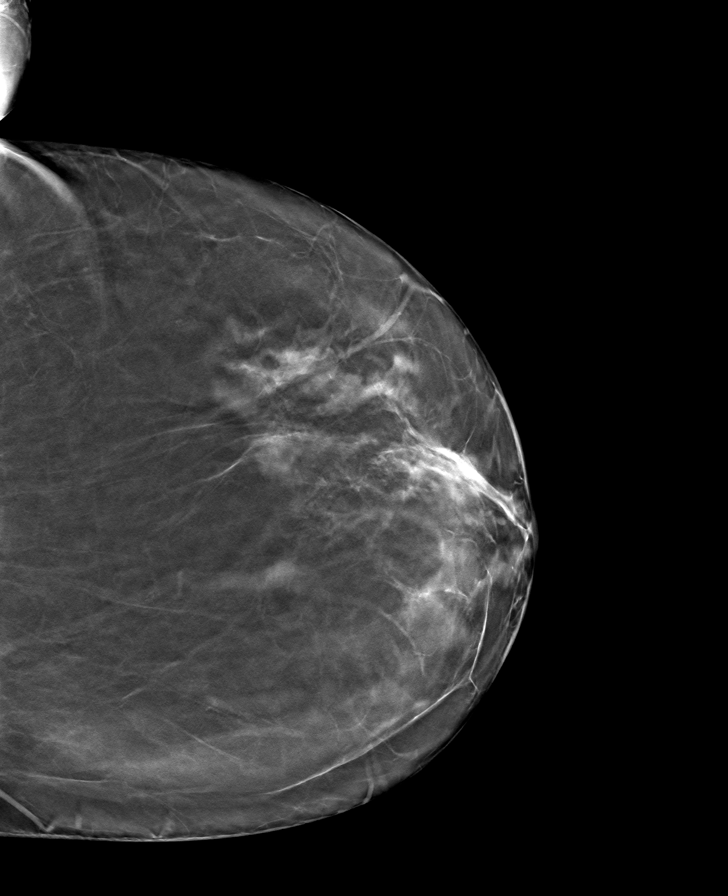

[L MLO tomo · tomo slice 43/86.0]
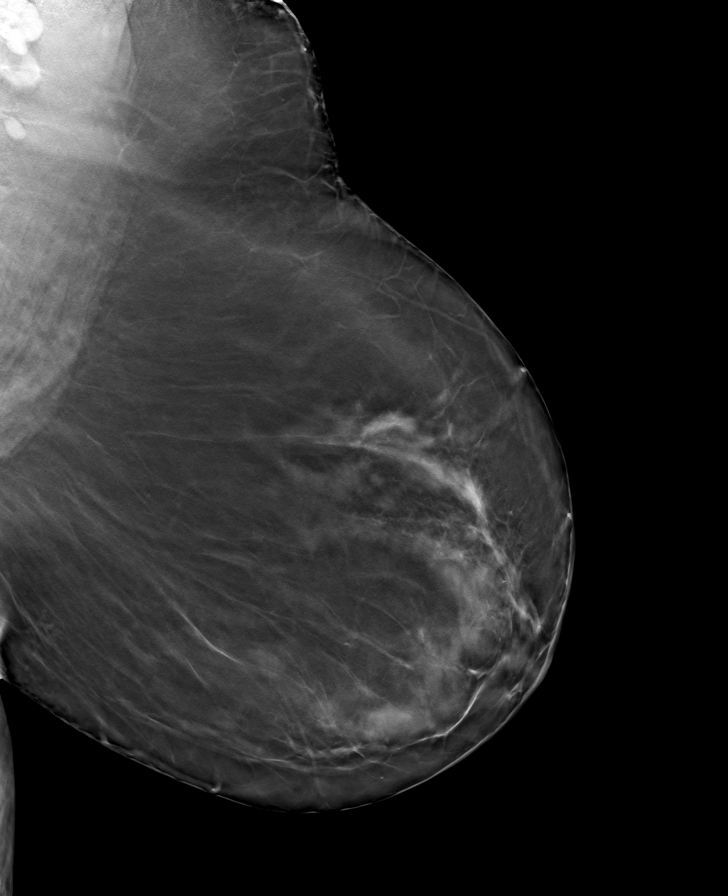

[8 of 24 positions shown; findings below may reference images not displayed]

ACR Breast Density Category b: There are scattered areas of
fibroglandular density.
FINDINGS: There are no findings suspicious for malignancy.
IMPRESSION: No mammographic evidence of malignancy. A result letter of this
screening mammogram will be mailed directly to the patient.

RECOMMENDATION:
Screening mammogram in one year. (Code:51-O-LD2)

BI-RADS CATEGORY  1: Negative.

## 2022-12-08 ENCOUNTER — Other Ambulatory Visit: Payer: Self-pay

## 2022-12-08 MED ORDER — CELECOXIB 200 MG PO CAPS: 200.0000 mg | ORAL_CAPSULE | Freq: Every day | ORAL | 1 refills | Status: DC

## 2023-04-21 ENCOUNTER — Other Ambulatory Visit: Payer: Self-pay | Admitting: Internal Medicine

## 2023-04-21 ENCOUNTER — Telehealth: Payer: Self-pay | Admitting: Internal Medicine

## 2023-04-21 ENCOUNTER — Other Ambulatory Visit: Payer: Self-pay

## 2023-04-21 MED ORDER — LEVOTHYROXINE SODIUM 75 MCG PO TABS: 75.0000 ug | ORAL_TABLET | Freq: Every day | ORAL | 0 refills | Status: DC

## 2023-04-21 MED ORDER — SIMVASTATIN 20 MG PO TABS: 20.0000 mg | ORAL_TABLET | Freq: Every day | ORAL | 0 refills | Status: DC

## 2023-04-21 NOTE — Telephone Encounter (Signed)
I will do a 30 day refill for both but she has not been see since November for a regular follow up and December was a sick visit. We can only refill regular meds if they have been seen within 6 months and she is at 6 months.

## 2023-04-21 NOTE — Telephone Encounter (Signed)
I can't send a 90 day supply until the patient is seen, she was last seen in November 2023. I can only do 30 days

## 2023-04-21 NOTE — Telephone Encounter (Signed)
Ok that's fine I didn't want her to be without so just whenever she can get in

## 2023-04-22 DIAGNOSIS — K080 Exfoliation of teeth due to systemic causes: Secondary | ICD-10-CM | POA: Diagnosis not present

## 2023-04-23 NOTE — Telephone Encounter (Signed)
Did patient made appt to be seen ?

## 2023-04-28 ENCOUNTER — Other Ambulatory Visit: Payer: Self-pay | Admitting: Internal Medicine

## 2023-04-28 DIAGNOSIS — Z1231 Encounter for screening mammogram for malignant neoplasm of breast: Secondary | ICD-10-CM

## 2023-05-01 ENCOUNTER — Ambulatory Visit
Admission: RE | Admit: 2023-05-01 | Discharge: 2023-05-01 | Disposition: A | Discharge status: Home / Self Care | Payer: Medicare Other | Source: Ambulatory Visit | Attending: Internal Medicine | Admitting: Internal Medicine

## 2023-05-01 DIAGNOSIS — Z1231 Encounter for screening mammogram for malignant neoplasm of breast: Secondary | ICD-10-CM | POA: Diagnosis not present

## 2023-05-05 ENCOUNTER — Other Ambulatory Visit: Payer: Self-pay | Admitting: Internal Medicine

## 2023-05-05 DIAGNOSIS — R928 Other abnormal and inconclusive findings on diagnostic imaging of breast: Secondary | ICD-10-CM

## 2023-05-11 ENCOUNTER — Other Ambulatory Visit: Payer: Medicare Other

## 2023-05-11 DIAGNOSIS — H401122 Primary open-angle glaucoma, left eye, moderate stage: Secondary | ICD-10-CM | POA: Diagnosis not present

## 2023-05-12 ENCOUNTER — Encounter: Payer: Self-pay | Admitting: Internal Medicine

## 2023-05-12 ENCOUNTER — Ambulatory Visit: Payer: Medicare Other | Admitting: Internal Medicine

## 2023-05-12 VITALS — BP 134/84 | HR 77 | Temp 97.7°F | Resp 18 | Ht <= 58 in | Wt 183.5 lb

## 2023-05-12 DIAGNOSIS — E039 Hypothyroidism, unspecified: Secondary | ICD-10-CM

## 2023-05-12 DIAGNOSIS — R197 Diarrhea, unspecified: Secondary | ICD-10-CM | POA: Diagnosis not present

## 2023-05-12 DIAGNOSIS — Z0000 Encounter for general adult medical examination without abnormal findings: Secondary | ICD-10-CM | POA: Insufficient documentation

## 2023-05-12 DIAGNOSIS — Z131 Encounter for screening for diabetes mellitus: Secondary | ICD-10-CM

## 2023-05-12 DIAGNOSIS — E785 Hyperlipidemia, unspecified: Secondary | ICD-10-CM

## 2023-05-12 DIAGNOSIS — Z1211 Encounter for screening for malignant neoplasm of colon: Secondary | ICD-10-CM | POA: Insufficient documentation

## 2023-05-12 NOTE — Assessment & Plan Note (Signed)
I will do lipid panel today. 

## 2023-05-12 NOTE — Progress Notes (Signed)
   Office Visit  Subjective   Patient ID: Robyn Cooper   DOB: 1951-02-23   Age: 72 y.o.   MRN: [id number]   Chief Complaint Chief Complaint  Patient presents with   Follow-up    Follow up     History of Present Illness 72 years old female is here for follow up. She says for 3 days every time she ate, she has to run to bathroom and has loose stools.  This is going on for 3 days. She denies any abdomen cramps or fouls smell. No abdomen pain. She is not sure if any particular food cause diarrhea.   She also says she got letter about something not right in her breast and she will have repeat mammogram next month.  She also has hypothyroidism and she takes levothyroxine 75 mcg daily.   She also as hyperlipidemia and takes levothyroxie 75 mcg daily.  Past Medical History No past medical history on file.   Allergies No Known Allergies   Review of Systems Review of Systems  Constitutional: Negative.   HENT: Negative.    Respiratory: Negative.    Cardiovascular: Negative.   Gastrointestinal:  Positive for diarrhea.  Neurological: Negative.        Objective:    Vitals BP 134/84 (BP Location: Left Arm, Patient Position: Sitting, Cuff Size: Normal)   Pulse 77   Temp 97.7 F (36.5 C)   Resp 18   Ht 4\' 10"  (1.473 m)   Wt 183 lb 8 oz (83.2 kg)   SpO2 98%   BMI 38.35 kg/m    Physical Examination Physical Exam Constitutional:      Appearance: Normal appearance.  HENT:     Head: Normocephalic and atraumatic.  Neurological:     Mental Status: She is alert.        Assessment & Plan:   Hypothyroidism (acquired) I will do TSH today.  Dyslipidemia I will do lipid panel today.  Screening for diabetes mellitus (DM) I will do HbA1c today.  Diarrhea She will take one imodium before meal and that did not help then she will call.    Return in about 3 months (around 08/12/2023).   Eloisa Northern, MD

## 2023-05-12 NOTE — Assessment & Plan Note (Signed)
I will do TSH today.

## 2023-05-13 DIAGNOSIS — R197 Diarrhea, unspecified: Secondary | ICD-10-CM | POA: Insufficient documentation

## 2023-05-13 LAB — T4, FREE: Free T4: 1.38 ng/dL (ref 0.82–1.77)

## 2023-05-13 LAB — TSH: TSH: 1.2 u[IU]/mL (ref 0.450–4.500)

## 2023-05-13 LAB — CMP14 + ANION GAP
ALT: 28 IU/L (ref 0–32)
AST: 19 IU/L (ref 0–40)
Albumin: 4.2 g/dL (ref 3.8–4.8)
Alkaline Phosphatase: 102 IU/L (ref 44–121)
Anion Gap: 14 mmol/L (ref 10.0–18.0)
BUN/Creatinine Ratio: 26 (ref 12–28)
BUN: 21 mg/dL (ref 8–27)
Bilirubin Total: 0.4 mg/dL (ref 0.0–1.2)
CO2: 25 mmol/L (ref 20–29)
Calcium: 9.6 mg/dL (ref 8.7–10.3)
Chloride: 102 mmol/L (ref 96–106)
Creatinine, Ser: 0.8 mg/dL (ref 0.57–1.00)
Globulin, Total: 2.2 g/dL (ref 1.5–4.5)
Glucose: 124 mg/dL — ABNORMAL HIGH (ref 70–99)
Potassium: 4.3 mmol/L (ref 3.5–5.2)
Sodium: 141 mmol/L (ref 134–144)
Total Protein: 6.4 g/dL (ref 6.0–8.5)
eGFR: 78 mL/min/{1.73_m2} (ref >59)

## 2023-05-13 LAB — HEMOGLOBIN A1C
Est. average glucose Bld gHb Est-mCnc: 131 mg/dL
Hgb A1c MFr Bld: 6.2 % — ABNORMAL HIGH (ref 4.8–5.6)

## 2023-05-13 LAB — LIPID PANEL
Chol/HDL Ratio: 2.6 ratio (ref 0.0–4.4)
Cholesterol, Total: 187 mg/dL (ref 100–199)
HDL: 72 mg/dL (ref >39)
LDL Chol Calc (NIH): 77 mg/dL (ref 0–99)
Triglycerides: 237 mg/dL — ABNORMAL HIGH (ref 0–149)
VLDL Cholesterol Cal: 38 mg/dL (ref 5–40)

## 2023-05-13 NOTE — Assessment & Plan Note (Signed)
I will do HbA1c today.  

## 2023-05-13 NOTE — Assessment & Plan Note (Signed)
She will take one imodium before meal and that did not help then she will call.

## 2023-05-14 ENCOUNTER — Telehealth: Payer: Self-pay | Admitting: Internal Medicine

## 2023-05-14 MED ORDER — METFORMIN HCL ER 500 MG PO TB24: 500.0000 mg | ORAL_TABLET | Freq: Every day | ORAL | 6 refills | Status: DC

## 2023-05-14 NOTE — Telephone Encounter (Signed)
I have spoken with her about blood results and she has borderline diabetes and I have suggested to watch her diet and will add metformin 500 mg daily.

## 2023-05-19 ENCOUNTER — Other Ambulatory Visit: Payer: Self-pay

## 2023-05-19 ENCOUNTER — Other Ambulatory Visit: Payer: Self-pay | Admitting: Internal Medicine

## 2023-05-26 ENCOUNTER — Ambulatory Visit
Admission: RE | Admit: 2023-05-26 | Discharge: 2023-05-26 | Disposition: A | Discharge status: Home / Self Care | Payer: Medicare Other | Source: Ambulatory Visit | Attending: Internal Medicine | Admitting: Internal Medicine

## 2023-05-26 ENCOUNTER — Ambulatory Visit: Payer: Medicare Other

## 2023-05-26 DIAGNOSIS — R928 Other abnormal and inconclusive findings on diagnostic imaging of breast: Secondary | ICD-10-CM | POA: Diagnosis not present

## 2023-06-01 DIAGNOSIS — H18601 Keratoconus, unspecified, right eye: Secondary | ICD-10-CM | POA: Diagnosis not present

## 2023-06-12 DIAGNOSIS — H401122 Primary open-angle glaucoma, left eye, moderate stage: Secondary | ICD-10-CM | POA: Diagnosis not present

## 2023-06-20 ENCOUNTER — Other Ambulatory Visit: Payer: Self-pay | Admitting: Internal Medicine

## 2023-06-22 DIAGNOSIS — K080 Exfoliation of teeth due to systemic causes: Secondary | ICD-10-CM | POA: Diagnosis not present

## 2023-08-04 DIAGNOSIS — H401122 Primary open-angle glaucoma, left eye, moderate stage: Secondary | ICD-10-CM | POA: Diagnosis not present

## 2023-08-11 ENCOUNTER — Ambulatory Visit: Payer: Medicare Other | Admitting: Internal Medicine

## 2023-08-11 ENCOUNTER — Encounter: Payer: Self-pay | Admitting: Internal Medicine

## 2023-08-11 VITALS — BP 124/78 | HR 82 | Temp 97.3°F | Resp 18 | Ht <= 58 in | Wt 189.0 lb

## 2023-08-11 DIAGNOSIS — E785 Hyperlipidemia, unspecified: Secondary | ICD-10-CM | POA: Diagnosis not present

## 2023-08-11 DIAGNOSIS — R197 Diarrhea, unspecified: Secondary | ICD-10-CM | POA: Diagnosis not present

## 2023-08-11 DIAGNOSIS — E039 Hypothyroidism, unspecified: Secondary | ICD-10-CM

## 2023-08-11 DIAGNOSIS — R7303 Prediabetes: Secondary | ICD-10-CM

## 2023-08-11 NOTE — Assessment & Plan Note (Signed)
She will continue to watch her diet and cut down portion of her meal. I will repeat HbA1c on next visit.

## 2023-08-11 NOTE — Assessment & Plan Note (Signed)
Due to metformin, so I will stop metformin.

## 2023-08-11 NOTE — Assessment & Plan Note (Signed)
Her BMI is 39, with underlying dyslipidemia, borderline diabetes and hypothyroidism make it morbidly obese. She will continue to watch her diet.

## 2023-08-11 NOTE — Assessment & Plan Note (Signed)
Stable

## 2023-08-11 NOTE — Progress Notes (Signed)
Office Visit  Subjective   Patient ID: Robyn Cooper   DOB: [birth year]   Age: 72 y.o.   MRN: [id number]   Chief Complaint Chief Complaint  Patient presents with   Follow-up    72  month follow up     History of Present Illness 72 years old female is here for follow up. She says that she has diarrhea since she started taking metformin. She some day has so much diarrhea that is embarrassing. She has borderline diabetes and her HbA1c was 6.2% in June 24. She has family history of diabetes.     She also has hypothyroidism and she takes levothyroxine 75 mcg daily.  Her TSH was normal in June 24.    She also as hyperlipidemia and takes simvastatin 20 mg daily. I have reviewed her lipid panel and  CMP that was drawn last visit.   She is a obese female with BMI of 39. She has lost 6 pounds since last visit. She was given phentermine but she stop that.   Past Medical History No past medical history on file.   Allergies No Known Allergies   Review of Systems Review of Systems  Constitutional: Negative.   HENT: Negative.    Respiratory: Negative.    Cardiovascular: Negative.   Gastrointestinal:  Positive for diarrhea.  Neurological: Negative.        Objective:    Vitals BP 124/78 (BP Location: Left Arm, Patient Position: Sitting, Cuff Size: Normal)   Pulse 82   Temp (!) 97.3 F (36.3 C)   Resp 18   Ht 4\' 10"  (1.473 m)   Wt 189 lb (85.7 kg)   SpO2 96%   BMI 39.50 kg/m    Physical Examination Physical Exam Constitutional:      Appearance: Normal appearance. She is obese.  HENT:     Head: Normocephalic and atraumatic.  Cardiovascular:     Rate and Rhythm: Normal rate and regular rhythm.     Heart sounds: Normal heart sounds.  Pulmonary:     Effort: Pulmonary effort is normal.     Breath sounds: Normal breath sounds.  Abdominal:     General: Bowel sounds are normal.     Palpations: Abdomen is soft.  Neurological:     General: No focal deficit present.     Mental  Status: She is alert and oriented to person, place, and time.        Assessment & Plan:   Hypothyroidism (acquired) Stable.  Morbid obesity (HCC) Her BMI is 39, with underlying dyslipidemia, borderline diabetes and hypothyroidism make it morbidly obese. She will continue to watch her diet.  Dyslipidemia Stable.  Diarrhea Due to metformin, so I will stop metformin.  Borderline diabetes mellitus She will continue to watch her diet and cut down portion of her meal. I will repeat HbA1c on next visit.    Return in about 3 months (around 11/10/2023).   Eloisa Northern, MD

## 2023-10-29 DIAGNOSIS — K080 Exfoliation of teeth due to systemic causes: Secondary | ICD-10-CM | POA: Diagnosis not present

## 2023-11-10 ENCOUNTER — Ambulatory Visit: Payer: Medicare Other | Admitting: Internal Medicine

## 2023-11-10 VITALS — BP 124/80 | HR 74 | Temp 97.4°F | Resp 18 | Wt 189.0 lb

## 2023-11-10 DIAGNOSIS — Z0000 Encounter for general adult medical examination without abnormal findings: Secondary | ICD-10-CM

## 2023-11-10 DIAGNOSIS — E039 Hypothyroidism, unspecified: Secondary | ICD-10-CM | POA: Diagnosis not present

## 2023-11-10 DIAGNOSIS — R7303 Prediabetes: Secondary | ICD-10-CM

## 2023-11-10 DIAGNOSIS — E785 Hyperlipidemia, unspecified: Secondary | ICD-10-CM

## 2023-11-10 DIAGNOSIS — Z6839 Body mass index (BMI) 39.0-39.9, adult: Secondary | ICD-10-CM

## 2023-11-10 MED ORDER — PNEUMOCOCCAL 20-VAL CONJ VACC 0.5 ML IM SUSY: 0.5000 mL | PREFILLED_SYRINGE | INTRAMUSCULAR | 0 refills | Status: AC

## 2023-11-10 MED ORDER — AREXVY 120 MCG/0.5ML IM SUSR: 0.5000 mL | Freq: Once | INTRAMUSCULAR | 0 refills | Status: AC

## 2023-11-10 NOTE — Assessment & Plan Note (Signed)
She is on Simvastatin 20 mg daily and in June 24, her LDL was therapeutic.

## 2023-11-10 NOTE — Assessment & Plan Note (Signed)
Her BMI is 39, with dyslipidemia and hypothyroidism make it morbidly obese.

## 2023-11-10 NOTE — Assessment & Plan Note (Signed)
controlled 

## 2023-11-10 NOTE — Progress Notes (Signed)
Office Visit  Subjective   Patient ID: Robyn Cooper   DOB: Oct 07, 1951   Age: 72 y.o.   MRN: [id number]   Chief Complaint Chief Complaint  Patient presents with   Annual Exam    Medicare exam     History of Present Illness 72 years old female is here for AWE.  She live alone, she does not smoke, she occasionally drink. She missed step when she fell down, no injury.   She stay very active, she drive with out any driving. She is independent in all ADL. She score 30/30 on MMSE.   She has flu shot this year. She has COVID and she has shingle vaccine but no pneumonia vaccine. She has tetanus vaccine given in 2023. She is due for pneumonia and RSV vaccine.   She has borderline diabetes and her HbA1c was 6.2% in June 24. She has family history of diabetes.     She also has hypothyroidism and she takes levothyroxine 75 mcg daily.  Her TSH was normal in June 24.    She also as hyperlipidemia and takes simvastatin 20 mg daily. I have reviewed her lipid panel and  CMP that was drawn last visit.    She is a obese female with BMI of 39. She has lost 6 pounds since last visit. She was given phentermine but she stop that.   She has mammogram this year. She does not check her breast. She has one colonoscopy many years ago. She wanted.   She has overy and uterus. She is a widow. She did not have pap smear in 10 years.   She has dexa scan done by Dr. Pete Glatter.   Past Medical History No past medical history on file.   Allergies No Known Allergies   Review of Systems Review of Systems  Constitutional: Negative.   HENT: Negative.    Respiratory: Negative.    Cardiovascular: Negative.   Gastrointestinal: Negative.   Neurological: Negative.        Objective:    Vitals BP 124/80 (BP Location: Left Arm, Patient Position: Sitting, Cuff Size: Normal)   Pulse 74   Temp (!) 97.4 F (36.3 C)   Resp 18   Wt 189 lb (85.7 kg)   SpO2 98%   BMI 39.50 kg/m    Physical  Examination Physical Exam Constitutional:      Appearance: Normal appearance. She is obese.  HENT:     Head: Normocephalic and atraumatic.  Cardiovascular:     Rate and Rhythm: Normal rate and regular rhythm.     Heart sounds: Normal heart sounds.  Pulmonary:     Effort: Pulmonary effort is normal.     Breath sounds: Normal breath sounds.  Abdominal:     General: Bowel sounds are normal.     Palpations: Abdomen is soft.  Neurological:     General: No focal deficit present.     Mental Status: She is alert and oriented to person, place, and time.        Assessment & Plan:   Hypothyroidism (acquired) controlled  Borderline diabetes mellitus Her last HBA1c was 6.2% in June 24. She will continue to watch her diet. She could not tolerate metformin.  Dyslipidemia She is on Simvastatin 20 mg daily and in June 24, her LDL was therapeutic.   Morbid obesity (HCC) Her BMI is 39, with dyslipidemia and hypothyroidism make it morbidly obese.  Well adult exam I will send RSV and Pneumonia vaccine. She will have  bone density scan and I will give her FIT test kit.     Return in about 3 months (around 02/08/2024).   Eloisa Northern, MD

## 2023-11-10 NOTE — Assessment & Plan Note (Signed)
Her last HBA1c was 6.2% in June 24. She will continue to watch her diet. She could not tolerate metformin.

## 2023-11-10 NOTE — Assessment & Plan Note (Signed)
I will send RSV and Pneumonia vaccine. She will have bone density scan and I will give her FIT test kit.

## 2023-12-04 ENCOUNTER — Ambulatory Visit
Admission: RE | Admit: 2023-12-04 | Discharge: 2023-12-04 | Disposition: A | Discharge status: Home / Self Care | Payer: Medicare Other | Source: Ambulatory Visit | Attending: Internal Medicine | Admitting: Internal Medicine

## 2023-12-04 DIAGNOSIS — E2839 Other primary ovarian failure: Secondary | ICD-10-CM | POA: Diagnosis not present

## 2023-12-04 DIAGNOSIS — M8588 Other specified disorders of bone density and structure, other site: Secondary | ICD-10-CM | POA: Diagnosis not present

## 2023-12-04 DIAGNOSIS — N958 Other specified menopausal and perimenopausal disorders: Secondary | ICD-10-CM | POA: Diagnosis not present

## 2023-12-08 DIAGNOSIS — H401122 Primary open-angle glaucoma, left eye, moderate stage: Secondary | ICD-10-CM | POA: Diagnosis not present

## 2024-02-02 ENCOUNTER — Encounter: Payer: Self-pay | Admitting: Internal Medicine

## 2024-02-02 ENCOUNTER — Ambulatory Visit: Payer: Medicare Other | Admitting: Internal Medicine

## 2024-02-02 VITALS — BP 124/74 | HR 83 | Temp 98.0°F | Resp 18 | Ht <= 58 in

## 2024-02-02 DIAGNOSIS — R7303 Prediabetes: Secondary | ICD-10-CM

## 2024-02-02 DIAGNOSIS — E039 Hypothyroidism, unspecified: Secondary | ICD-10-CM | POA: Diagnosis not present

## 2024-02-02 DIAGNOSIS — E785 Hyperlipidemia, unspecified: Secondary | ICD-10-CM | POA: Diagnosis not present

## 2024-02-02 DIAGNOSIS — Z1211 Encounter for screening for malignant neoplasm of colon: Secondary | ICD-10-CM

## 2024-02-02 NOTE — Assessment & Plan Note (Signed)
 We will repeat her lipid panel on next visit.  She denies having any side effect from simvastatin 20 mg daily.

## 2024-02-02 NOTE — Assessment & Plan Note (Signed)
 She stay active, her HbA1c was 6.2 in June 24. I will repeat labs on next visit

## 2024-02-02 NOTE — Addendum Note (Signed)
 Addended byEloisa Northern on: 02/02/2024 03:46 PM   Modules accepted: Orders

## 2024-02-02 NOTE — Assessment & Plan Note (Signed)
 Her BMI is 39 with underlying borderline diabetes, hypothyroidism and dyslipidemia make it morbidly obese.  She will continue to watch her diet and stay active.

## 2024-02-02 NOTE — Assessment & Plan Note (Signed)
 She is euthyroid, she takes levothyroxine.

## 2024-02-02 NOTE — Progress Notes (Signed)
   Office Visit  Subjective   Patient ID: Robyn Cooper   DOB: 1951/07/01   Age: 74 y.o.   MRN: [id number]   Chief Complaint Chief Complaint  Patient presents with   Follow-up    3 month follow up     History of Present Illness 73 years old female who is here for follow-up.  She has borderline diabetes and her HbA1c was 6.2% in June 24.  She does not take any medications.  She says that both of her parents have diabetes.  She is stay active and watch her diet.   She also has hypothyroidism and she takes levothyroxine 75 mcg daily.  Her TSH was normal in June 24.    She also as hyperlipidemia and takes simvastatin 20 mg daily. I have reviewed her lipid panel and  CMP that was drawn last visit.    She is a obese female with BMI of 39. She she could not have weight today because her machine broke.  She says that she can tell from her close that her weight remains the same.    Past Medical History No past medical history on file.   Allergies No Known Allergies   Review of Systems Review of Systems  Constitutional: Negative.   HENT: Negative.    Respiratory: Negative.    Cardiovascular: Negative.   Gastrointestinal: Negative.   Neurological: Negative.        Objective:    Vitals BP 124/74 (BP Location: Left Arm, Patient Position: Sitting, Cuff Size: Normal)   Pulse 83   Temp 98 F (36.7 C)   Resp 18   Ht 4\' 10"  (1.473 m)   SpO2 97%   BMI 39.50 kg/m    Physical Examination Physical Exam Constitutional:      Appearance: Normal appearance. She is obese.  HENT:     Head: Normocephalic and atraumatic.  Cardiovascular:     Rate and Rhythm: Normal rate and regular rhythm.     Heart sounds: Normal heart sounds.  Pulmonary:     Effort: Pulmonary effort is normal.     Breath sounds: Normal breath sounds.  Neurological:     General: No focal deficit present.     Mental Status: She is alert and oriented to person, place, and time.        Assessment & Plan:    Hypothyroidism (acquired) She is euthyroid, she takes levothyroxine.  Borderline diabetes mellitus She stay active, her HbA1c was 6.2 in June 24. I will repeat labs on next visit  Dyslipidemia We will repeat her lipid panel on next visit.  She denies having any side effect from simvastatin 20 mg daily.  Morbid obesity (HCC) Her BMI is 39 with underlying borderline diabetes, hypothyroidism and dyslipidemia make it morbidly obese.  She will continue to watch her diet and stay active.   She also says that she female stool occult blood card but never heard back.  I will give another if it test kit to her.  She will bring kit here.  Return in about 3 months (around 05/04/2024).   Eloisa Northern, MD

## 2024-02-08 ENCOUNTER — Other Ambulatory Visit: Payer: Self-pay

## 2024-02-08 DIAGNOSIS — Z1211 Encounter for screening for malignant neoplasm of colon: Secondary | ICD-10-CM

## 2024-02-09 LAB — FECAL OCCULT BLOOD, IMMUNOCHEMICAL: Fecal Occult Bld: NEGATIVE

## 2024-02-10 ENCOUNTER — Telehealth: Payer: Self-pay | Admitting: Internal Medicine

## 2024-02-10 NOTE — Telephone Encounter (Signed)
 I have informed her that her stool test is negative and will repeat it next year

## 2024-02-23 ENCOUNTER — Other Ambulatory Visit: Payer: Self-pay | Admitting: Internal Medicine

## 2024-02-23 ENCOUNTER — Other Ambulatory Visit: Payer: Self-pay

## 2024-02-23 ENCOUNTER — Ambulatory Visit: Admitting: Internal Medicine

## 2024-02-23 MED ORDER — PAXLOVID (300/100) 20 X 150 MG & 10 X 100MG PO TBPK: 3.0000 | ORAL_TABLET | Freq: Two times a day (BID) | ORAL | 0 refills | Status: DC

## 2024-02-23 MED ORDER — PAXLOVID (300/100) 20 X 150 MG & 10 X 100MG PO TBPK: 3.0000 | ORAL_TABLET | Freq: Two times a day (BID) | ORAL | 0 refills | Status: AC

## 2024-02-23 NOTE — Progress Notes (Signed)
 I have spoken with her, She was tested positive for COVID, she will stay athome and I will send Paxlovid for her. She will call if her symptoms got worse ot go to ED.

## 2024-04-12 DIAGNOSIS — Z01818 Encounter for other preprocedural examination: Secondary | ICD-10-CM | POA: Diagnosis not present

## 2024-04-12 DIAGNOSIS — H25811 Combined forms of age-related cataract, right eye: Secondary | ICD-10-CM | POA: Diagnosis not present

## 2024-05-03 ENCOUNTER — Ambulatory Visit: Admitting: Internal Medicine

## 2024-05-09 DIAGNOSIS — E039 Hypothyroidism, unspecified: Secondary | ICD-10-CM | POA: Diagnosis not present

## 2024-05-09 DIAGNOSIS — H52221 Regular astigmatism, right eye: Secondary | ICD-10-CM | POA: Diagnosis not present

## 2024-05-09 DIAGNOSIS — H25811 Combined forms of age-related cataract, right eye: Secondary | ICD-10-CM | POA: Diagnosis not present

## 2024-05-20 DIAGNOSIS — K080 Exfoliation of teeth due to systemic causes: Secondary | ICD-10-CM | POA: Diagnosis not present

## 2024-05-24 ENCOUNTER — Ambulatory Visit: Admitting: Internal Medicine

## 2024-05-24 ENCOUNTER — Other Ambulatory Visit: Payer: Self-pay | Admitting: Internal Medicine

## 2024-05-24 ENCOUNTER — Encounter: Payer: Self-pay | Admitting: Internal Medicine

## 2024-05-24 VITALS — BP 128/80 | HR 82 | Temp 97.8°F | Resp 18 | Ht <= 58 in | Wt 192.0 lb

## 2024-05-24 DIAGNOSIS — R7303 Prediabetes: Secondary | ICD-10-CM | POA: Diagnosis not present

## 2024-05-24 DIAGNOSIS — E039 Hypothyroidism, unspecified: Secondary | ICD-10-CM

## 2024-05-24 DIAGNOSIS — E785 Hyperlipidemia, unspecified: Secondary | ICD-10-CM | POA: Diagnosis not present

## 2024-05-24 NOTE — Progress Notes (Signed)
   Office Visit  Subjective   Patient ID: Robyn Cooper   DOB: 11-Apr-1951   Age: 73 y.o.   MRN: [id number]   Chief Complaint Chief Complaint  Patient presents with   Follow-up    3 month follow up     History of Present Illness 73 years old female who is here for follow-up. She has right eye cataract surgery few weeks ago and she can see much better now.  She already has left eye cataract surgery in the past.  She says that she is struggling to lose weight she has actually gained 5 more lb since March. Her weight today is 192 lb.  She says that she watch her diet and stay very active but unable to lose weight.  Her BMI today is 40.   She has borderline diabetes and her HbA1c was 6.2% in June 24.  She does not take any medications.  She says that both of her parents have diabetes.  She is stay active and watch her diet.   She also has hypothyroidism and she takes levothyroxine  75 mcg daily.  Her TSH was normal in June 24.  She attributes levothyroxine  for her weight gain.   She also as hyperlipidemia and takes simvastatin  20 mg daily.   She ate 2 hour ago but she is due for a lipid panel today.       Past Medical History No past medical history on file.   Allergies No Known Allergies   Review of Systems Review of Systems  Constitutional: Negative.   HENT: Negative.    Respiratory: Negative.    Cardiovascular: Negative.   Gastrointestinal: Negative.   Neurological: Negative.        Objective:    Vitals BP 128/80   Pulse 82   Temp 97.8 F (36.6 C)   Resp 18   Ht 4' 10 (1.473 m)   Wt 192 lb (87.1 kg)   SpO2 97%   BMI 40.13 kg/m    Physical Examination Physical Exam Constitutional:      Appearance: Normal appearance.  HENT:     Head: Normocephalic and atraumatic.   Cardiovascular:     Rate and Rhythm: Normal rate and regular rhythm.     Heart sounds: Normal heart sounds.  Pulmonary:     Effort: Pulmonary effort is normal.     Breath sounds: Normal breath  sounds.  Abdominal:     Palpations: Abdomen is soft.   Neurological:     General: No focal deficit present.     Mental Status: She is alert and oriented to person, place, and time.        Assessment & Plan:   Hypothyroidism (acquired)   Clinically she does not have any symptoms of hypo or hyperthyroidism.  I will repeat TSH today.  Morbid obesity (HCC)   She is struggling to lose weight.  She will keep cutting down portion of her meal.  I have started her on phentermine but she stopped using it.  Dyslipidemia   She takes simvastatin  without any side effect.  Will do lipid panel.  Borderline diabetes mellitus   Her last hemoglobin A1c was 6.0.  I will repeat A1c today.    Return in about 3 months (around 08/24/2024).   Roetta Dare, MD

## 2024-05-24 NOTE — Assessment & Plan Note (Signed)
 Clinically she does not have any symptoms of hypo or hyperthyroidism.  I will repeat TSH today.

## 2024-05-24 NOTE — Assessment & Plan Note (Signed)
 She is struggling to lose weight.  She will keep cutting down portion of her meal.  I have started her on phentermine but she stopped using it.

## 2024-05-24 NOTE — Assessment & Plan Note (Signed)
 She takes simvastatin  without any side effect.  Will do lipid panel.

## 2024-05-24 NOTE — Assessment & Plan Note (Signed)
 Her last hemoglobin A1c was 6.0.  I will repeat A1c today.

## 2024-05-25 DIAGNOSIS — K080 Exfoliation of teeth due to systemic causes: Secondary | ICD-10-CM | POA: Diagnosis not present

## 2024-05-25 LAB — HGB A1C W/O EAG: Hgb A1c MFr Bld: 6 % — ABNORMAL HIGH (ref 4.8–5.6)

## 2024-05-25 LAB — COMPREHENSIVE METABOLIC PANEL WITH GFR
ALT: 37 IU/L — ABNORMAL HIGH (ref 0–32)
AST: 26 IU/L (ref 0–40)
Albumin: 4.1 g/dL (ref 3.8–4.8)
Alkaline Phosphatase: 102 IU/L (ref 44–121)
BUN/Creatinine Ratio: 28 (ref 12–28)
BUN: 19 mg/dL (ref 8–27)
Bilirubin Total: 0.3 mg/dL (ref 0.0–1.2)
CO2: 21 mmol/L (ref 20–29)
Calcium: 9.6 mg/dL (ref 8.7–10.3)
Chloride: 103 mmol/L (ref 96–106)
Creatinine, Ser: 0.68 mg/dL (ref 0.57–1.00)
Globulin, Total: 2.1 g/dL (ref 1.5–4.5)
Glucose: 107 mg/dL — ABNORMAL HIGH (ref 70–99)
Potassium: 4.2 mmol/L (ref 3.5–5.2)
Sodium: 142 mmol/L (ref 134–144)
Total Protein: 6.2 g/dL (ref 6.0–8.5)
eGFR: 92 mL/min/{1.73_m2} (ref >59)

## 2024-05-25 LAB — LIPID PANEL W/O CHOL/HDL RATIO
Cholesterol, Total: 197 mg/dL (ref 100–199)
HDL: 66 mg/dL (ref >39)
LDL Chol Calc (NIH): 87 mg/dL (ref 0–99)
Triglycerides: 267 mg/dL — ABNORMAL HIGH (ref 0–149)
VLDL Cholesterol Cal: 44 mg/dL — ABNORMAL HIGH (ref 5–40)

## 2024-05-25 LAB — TSH: TSH: 1.3 u[IU]/mL (ref 0.450–4.500)

## 2024-05-30 ENCOUNTER — Ambulatory Visit: Payer: Self-pay

## 2024-05-30 NOTE — Progress Notes (Signed)
 Patient called.  Patient aware. Please let her know that her labs are good.

## 2024-06-10 ENCOUNTER — Other Ambulatory Visit: Payer: Self-pay | Admitting: Internal Medicine

## 2024-07-01 ENCOUNTER — Other Ambulatory Visit: Payer: Self-pay | Admitting: Internal Medicine

## 2024-07-04 DIAGNOSIS — L209 Atopic dermatitis, unspecified: Secondary | ICD-10-CM | POA: Diagnosis not present

## 2024-08-01 DIAGNOSIS — L209 Atopic dermatitis, unspecified: Secondary | ICD-10-CM | POA: Diagnosis not present

## 2024-08-15 DIAGNOSIS — H401124 Primary open-angle glaucoma, left eye, indeterminate stage: Secondary | ICD-10-CM | POA: Diagnosis not present

## 2024-08-15 DIAGNOSIS — H524 Presbyopia: Secondary | ICD-10-CM | POA: Diagnosis not present

## 2024-08-15 DIAGNOSIS — H18623 Keratoconus, unstable, bilateral: Secondary | ICD-10-CM | POA: Diagnosis not present

## 2024-08-15 DIAGNOSIS — Z961 Presence of intraocular lens: Secondary | ICD-10-CM | POA: Diagnosis not present

## 2024-08-23 ENCOUNTER — Encounter: Payer: Self-pay | Admitting: Internal Medicine

## 2024-08-23 ENCOUNTER — Ambulatory Visit: Admitting: Internal Medicine

## 2024-08-23 VITALS — BP 130/80 | HR 79 | Temp 97.0°F | Resp 18 | Ht <= 58 in | Wt 195.5 lb

## 2024-08-23 DIAGNOSIS — E785 Hyperlipidemia, unspecified: Secondary | ICD-10-CM

## 2024-08-23 DIAGNOSIS — E039 Hypothyroidism, unspecified: Secondary | ICD-10-CM

## 2024-08-23 DIAGNOSIS — R7303 Prediabetes: Secondary | ICD-10-CM | POA: Diagnosis not present

## 2024-08-23 NOTE — Assessment & Plan Note (Signed)
 She has gained 3 more lb and her weight is 195 lb with BMI of 40. She will cut down portion of her meal.  She will also increase activity.

## 2024-08-23 NOTE — Progress Notes (Signed)
   Office Visit  Subjective   Patient ID: Robyn Cooper   DOB: Jun 20, 1951   Age: 73 y.o.   MRN: [id number]   Chief Complaint Chief Complaint  Patient presents with   Follow-up    3 month follow up     History of Present Illness  73 years old female who is here for follow-up.   She says that she try to watch her diet but unable to lose weight.  Her hemoglobin A1c was slightly better from 6.2 years ago to 6.0 in July this year.   She says that she will start walking more.  She did not have any exertional chest pain or shortness of breath.She does not take any medications.  She says that both of her parents have diabetes.  She is stay active and watch her diet.  She also has hypothyroidism and she takes levothyroxine  75 mcg daily.  Her TSH was normal in July 2025. And she do not have any symptoms of hypo or hyperthyroidism.   She also as hyperlipidemia and takes simvastatin  20 mg daily.    Her lipid panel was done July 25 shows LDL was target controlled.  She does not have any side effects from this medicine.  Past Medical History No past medical history on file.   Allergies No Known Allergies   Review of Systems Review of Systems  Constitutional: Negative.   HENT: Negative.    Respiratory: Negative.    Cardiovascular: Negative.   Gastrointestinal: Negative.   Neurological: Negative.        Objective:    Vitals BP 130/80   Pulse 79   Temp (!) 97 F (36.1 C)   Resp 18   Ht 4' 10 (1.473 m)   Wt 195 lb 8 oz (88.7 kg)   SpO2 97%   BMI 40.86 kg/m    Physical Examination Physical Exam Constitutional:      Appearance: Normal appearance. She is obese.  HENT:     Head: Normocephalic and atraumatic.  Eyes:     Extraocular Movements: Extraocular movements intact.     Pupils: Pupils are equal, round, and reactive to light.  Cardiovascular:     Rate and Rhythm: Normal rate and regular rhythm.     Heart sounds: Normal heart sounds.  Abdominal:     General: Bowel sounds  are normal.     Palpations: Abdomen is soft.  Neurological:     General: No focal deficit present.     Mental Status: She is alert and oriented to person, place, and time.        Assessment & Plan:   Hypothyroidism (acquired)   Her TSH was therapeutic.  She will continue with levothyroxine  75 mcg daily.  Morbid obesity (HCC)   She has gained 3 more lb and her weight is 195 lb with BMI of 40. She will cut down portion of her meal.  She will also increase activity.  Dyslipidemia   Her LDL is target controlled.  Will continue with simvastatin .  Borderline diabetes mellitus   Her hemoglobin A1c was 6.0.  She will continue to watch her diet.    Return in about 3 months (around 11/22/2024) for   Will do annual wellness visit on next visit..   Simaya Lumadue, MD

## 2024-08-23 NOTE — Assessment & Plan Note (Signed)
 Her LDL is target controlled.  Will continue with simvastatin .

## 2024-08-23 NOTE — Assessment & Plan Note (Signed)
 Her hemoglobin A1c was 6.0.  She will continue to watch her diet.

## 2024-08-23 NOTE — Assessment & Plan Note (Signed)
 Her TSH was therapeutic.  She will continue with levothyroxine  75 mcg daily.

## 2024-08-29 ENCOUNTER — Other Ambulatory Visit: Payer: Self-pay | Admitting: Internal Medicine

## 2024-08-29 DIAGNOSIS — Z1231 Encounter for screening mammogram for malignant neoplasm of breast: Secondary | ICD-10-CM

## 2024-08-30 ENCOUNTER — Ambulatory Visit
Admission: RE | Admit: 2024-08-30 | Discharge: 2024-08-30 | Disposition: A | Discharge status: Home / Self Care | Source: Ambulatory Visit | Attending: Internal Medicine | Admitting: Internal Medicine

## 2024-08-30 DIAGNOSIS — Z1231 Encounter for screening mammogram for malignant neoplasm of breast: Secondary | ICD-10-CM | POA: Diagnosis not present

## 2024-09-02 ENCOUNTER — Other Ambulatory Visit: Payer: Self-pay | Admitting: Internal Medicine

## 2024-09-02 DIAGNOSIS — R928 Other abnormal and inconclusive findings on diagnostic imaging of breast: Secondary | ICD-10-CM

## 2024-09-09 ENCOUNTER — Ambulatory Visit

## 2024-09-09 ENCOUNTER — Ambulatory Visit
Admission: RE | Admit: 2024-09-09 | Discharge: 2024-09-09 | Disposition: A | Discharge status: Home / Self Care | Source: Ambulatory Visit | Attending: Internal Medicine | Admitting: Internal Medicine

## 2024-09-09 DIAGNOSIS — R928 Other abnormal and inconclusive findings on diagnostic imaging of breast: Secondary | ICD-10-CM | POA: Diagnosis not present

## 2024-09-22 ENCOUNTER — Ambulatory Visit: Payer: Self-pay

## 2024-09-22 NOTE — Progress Notes (Signed)
 Patient called.  Patient aware.  Dr caleen: Her mammogram is normal

## 2024-10-04 DIAGNOSIS — H401122 Primary open-angle glaucoma, left eye, moderate stage: Secondary | ICD-10-CM | POA: Diagnosis not present

## 2024-11-10 ENCOUNTER — Encounter: Admitting: Internal Medicine

## 2024-11-11 ENCOUNTER — Encounter: Payer: Self-pay | Admitting: Internal Medicine

## 2024-11-11 ENCOUNTER — Encounter: Admitting: Internal Medicine

## 2024-11-11 VITALS — BP 128/78 | HR 87 | Temp 97.8°F | Resp 18 | Wt 195.5 lb

## 2024-11-11 DIAGNOSIS — Z1211 Encounter for screening for malignant neoplasm of colon: Secondary | ICD-10-CM

## 2024-11-11 DIAGNOSIS — E785 Hyperlipidemia, unspecified: Secondary | ICD-10-CM

## 2024-11-11 DIAGNOSIS — M85851 Other specified disorders of bone density and structure, right thigh: Secondary | ICD-10-CM | POA: Diagnosis not present

## 2024-11-11 DIAGNOSIS — Z0000 Encounter for general adult medical examination without abnormal findings: Secondary | ICD-10-CM | POA: Insufficient documentation

## 2024-11-11 DIAGNOSIS — R7303 Prediabetes: Secondary | ICD-10-CM

## 2024-11-11 DIAGNOSIS — E039 Hypothyroidism, unspecified: Secondary | ICD-10-CM | POA: Diagnosis not present

## 2024-11-11 MED ORDER — DICLOFENAC SODIUM 1 % EX GEL: 2.0000 g | Freq: Four times a day (QID) | CUTANEOUS | 0 refills | Status: AC

## 2024-11-11 NOTE — Progress Notes (Signed)
 "  Office Visit  Subjective   Patient ID: Robyn Cooper   DOB: 1951-09-17   Age: 73 y.o.   MRN: [id number]   Chief Complaint Chief Complaint  Patient presents with   Annual Exam    Medicare annual     History of Present Illness 73 years old female is here for AWE.  She live alone, she does not smoke, she occasionally drink. She missed step when she fell down, no injury.    She stay very active, she drive with out any driving. She is independent in all ADL. She score 30/30 on MMSE.    She has flu shot this year. She has COVID and she has shingle vaccine but no pneumonia vaccine. She has tetanus vaccine given in 2023. She is due for pneumonia and RSV vaccine.    She has borderline diabetes and her HbA1c was 6.0% in 05/24/24. She has family history of diabetes.     She also has hypothyroidism and she takes levothyroxine  75 mcg daily.  Her TSH was normal in June 24.    She also as hyperlipidemia and takes simvastatin  20 mg daily. I have reviewed her lipid panel and  CMP that was drawn last visit.    She is a obese female with BMI of 39. She has lost 6 pounds since last visit. She was given phentermine but she stop that.    She has mammogram this year. She does not check her breast. She has one colonoscopy many years ago. She wanted.    She has overy and uterus. She is a widow. She did not have pap smear in 10 years.    She has dexa scan done by Dr. Roseann.   Past Medical History No past medical history on file.   Allergies Allergies[1]   Review of Systems Review of Systems  Constitutional: Negative.   HENT: Negative.    Respiratory: Negative.    Cardiovascular: Negative.   Gastrointestinal: Negative.   Neurological: Negative.        Objective:    Vitals BP 128/78   Pulse 87   Temp 97.8 F (36.6 C)   Resp 18   Wt 195 lb 8 oz (88.7 kg)   SpO2 95%   BMI 40.86 kg/m    Physical Examination Physical Exam Constitutional:      Appearance: Normal appearance. She  is obese.  HENT:     Head: Normocephalic and atraumatic.  Cardiovascular:     Rate and Rhythm: Normal rate and regular rhythm.     Heart sounds: Normal heart sounds.  Pulmonary:     Effort: Pulmonary effort is normal.     Breath sounds: Normal breath sounds.  Abdominal:     General: Bowel sounds are normal.     Palpations: Abdomen is soft.  Neurological:     General: No focal deficit present.     Mental Status: She is alert and oriented to person, place, and time.        Assessment & Plan:   Hypothyroidism (acquired) Her TSH was therapeutic.  Well adult exam Her immunizations are up to date. I will arrange for cologuard.   Borderline diabetes mellitus Her HbA1c was 6.0 in July 2025. She will continue to watch her diet  Dyslipidemia She takes simvastatin  20 mg daily. She did not have any side effects. Her lipid panel was reviewed with her.   Morbid obesity (HCC) She wanted to try phentermine    Return in about 3 months (  around 02/09/2025).   Roetta Dare, MD      [1] No Known Allergies  "

## 2024-11-11 NOTE — Assessment & Plan Note (Signed)
 Her HbA1c was 6.0 in July 2025. She will continue to watch her diet

## 2024-11-11 NOTE — Assessment & Plan Note (Signed)
 Her immunizations are up to date. I will arrange for cologuard.

## 2024-11-11 NOTE — Assessment & Plan Note (Signed)
 She takes simvastatin  20 mg daily. She did not have any side effects. Her lipid panel was reviewed with her.

## 2024-11-11 NOTE — Assessment & Plan Note (Signed)
 Her TSH was therapeutic.

## 2024-11-11 NOTE — Assessment & Plan Note (Signed)
 She wanted to try phentermine

## 2024-11-13 DIAGNOSIS — M85851 Other specified disorders of bone density and structure, right thigh: Secondary | ICD-10-CM | POA: Insufficient documentation

## 2024-11-13 NOTE — Assessment & Plan Note (Signed)
"    She will continue with exercises.  He will continue with vitamin-D replacement. "

## 2024-11-22 ENCOUNTER — Encounter: Admitting: Internal Medicine

## 2025-02-10 ENCOUNTER — Ambulatory Visit: Admitting: Internal Medicine
